# Patient Record
Sex: Female | Born: 1985 | Race: White | Hispanic: No | Smoking: Former smoker
Health system: Southern US, Community
[De-identification: ages and names within clinical notes are randomized; demographics above are authoritative.]

## PROBLEM LIST (undated history)

## (undated) DIAGNOSIS — F32A Depression, unspecified: Secondary | ICD-10-CM

## (undated) DIAGNOSIS — T7840XA Allergy, unspecified, initial encounter: Secondary | ICD-10-CM

## (undated) DIAGNOSIS — F329 Major depressive disorder, single episode, unspecified: Secondary | ICD-10-CM

## (undated) DIAGNOSIS — R87619 Unspecified abnormal cytological findings in specimens from cervix uteri: Secondary | ICD-10-CM

## (undated) DIAGNOSIS — F419 Anxiety disorder, unspecified: Secondary | ICD-10-CM

## (undated) HISTORY — DX: Unspecified abnormal cytological findings in specimens from cervix uteri: R87.619

## (undated) HISTORY — PX: KNEE SURGERY: SHX244

## (undated) HISTORY — DX: Anxiety disorder, unspecified: F41.9

## (undated) HISTORY — DX: Depression, unspecified: F32.A

## (undated) HISTORY — DX: Allergy, unspecified, initial encounter: T78.40XA

## (undated) HISTORY — PX: TUBAL LIGATION: SHX77

## (undated) HISTORY — DX: Major depressive disorder, single episode, unspecified: F32.9

## (undated) HISTORY — PX: COLPOSCOPY: SHX161

## (undated) HISTORY — PX: WISDOM TOOTH EXTRACTION: SHX21

---
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
## 2003-07-12 ENCOUNTER — Other Ambulatory Visit: Admission: RE | Admit: 2003-07-12 | Discharge: 2003-07-12 | Payer: Self-pay | Admitting: Obstetrics and Gynecology

## 2003-07-19 ENCOUNTER — Encounter: Admission: RE | Admit: 2003-07-19 | Discharge: 2003-07-19 | Payer: Self-pay | Admitting: Family Medicine

## 2004-03-11 ENCOUNTER — Encounter: Admission: RE | Admit: 2004-03-11 | Discharge: 2004-03-11 | Payer: Self-pay | Admitting: Family Medicine

## 2004-12-06 ENCOUNTER — Emergency Department (HOSPITAL_COMMUNITY): Admission: EM | Admit: 2004-12-06 | Discharge: 2004-12-07 | Payer: Self-pay | Admitting: Emergency Medicine

## 2005-03-06 ENCOUNTER — Emergency Department (HOSPITAL_COMMUNITY): Admission: EM | Admit: 2005-03-06 | Discharge: 2005-03-07 | Payer: Self-pay | Admitting: Emergency Medicine

## 2005-08-12 ENCOUNTER — Ambulatory Visit (HOSPITAL_BASED_OUTPATIENT_CLINIC_OR_DEPARTMENT_OTHER): Admission: RE | Admit: 2005-08-12 | Discharge: 2005-08-13 | Payer: Self-pay | Admitting: Orthopaedic Surgery

## 2006-03-09 ENCOUNTER — Emergency Department (HOSPITAL_COMMUNITY): Admission: EM | Admit: 2006-03-09 | Discharge: 2006-03-09 | Payer: Self-pay | Admitting: Emergency Medicine

## 2006-03-09 ENCOUNTER — Other Ambulatory Visit: Admission: RE | Admit: 2006-03-09 | Discharge: 2006-03-09 | Payer: Self-pay | Admitting: Obstetrics & Gynecology

## 2007-02-12 ENCOUNTER — Emergency Department (HOSPITAL_COMMUNITY): Admission: EM | Admit: 2007-02-12 | Discharge: 2007-02-12 | Payer: Self-pay | Admitting: Emergency Medicine

## 2007-05-14 ENCOUNTER — Other Ambulatory Visit: Admission: RE | Admit: 2007-05-14 | Discharge: 2007-05-14 | Payer: Self-pay | Admitting: Obstetrics and Gynecology

## 2007-11-05 ENCOUNTER — Other Ambulatory Visit: Admission: RE | Admit: 2007-11-05 | Discharge: 2007-11-05 | Payer: Self-pay | Admitting: Obstetrics and Gynecology

## 2008-09-15 ENCOUNTER — Emergency Department (HOSPITAL_COMMUNITY): Admission: EM | Admit: 2008-09-15 | Discharge: 2008-09-15 | Payer: Self-pay | Admitting: Emergency Medicine

## 2008-10-05 IMAGING — CT CT ANGIO CHEST
2 of 9 series · 16 of 36 positions shown · IV contrast (APPLIED)
Comparison: None

CLINICAL DATA: Shortness of breath, cough

CT ANGIOGRAPHY OF CHEST - PULMONARY EMBOLISM PROTOCOL:
TECHNIQUE: Multidetector CT imaging of the chest was performed according to the
protocol for detection of pulmonary embolism during bolus injection of
intravenous contrast.  Coronal and sagittal plane CT angiographic image
reconstructions were also generated.
Contrast:  100 cc Omnipaque 300
TECHNIQUE: Multidetector CT imaging of the abdomen and pelvis was performed
following the standard protocol during bolus administration of intravenous
contrast.
ABDOMEN CT WITH CONTRAST

[Series 8: pe 1.0 b40f thins for pacs · axial · 0.70mm/px · z∈[+512,+720]mm · 12 of 246 slices shown]
[im 19/246  lung]
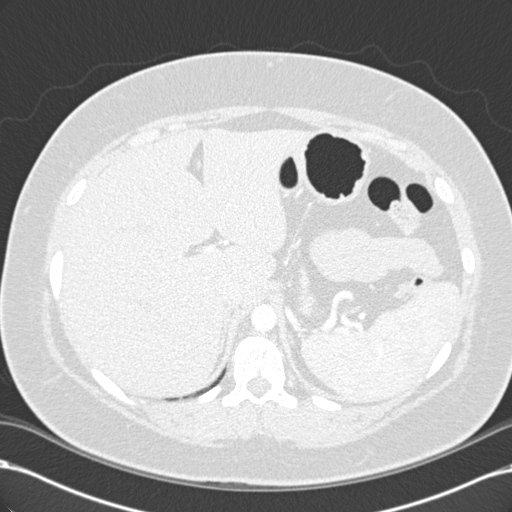
[im 38/246  mediastinal]
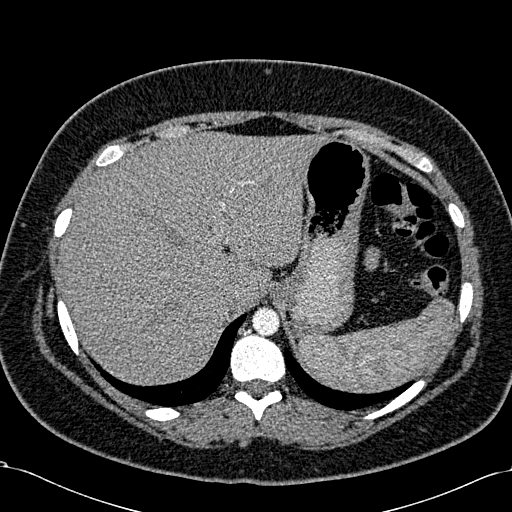
[im 57/246  lung]
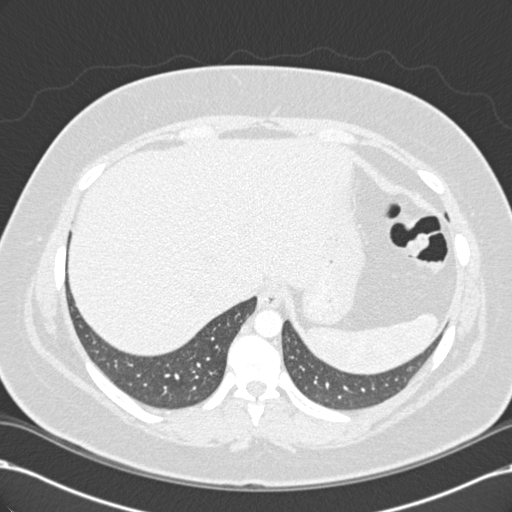
[im 76/246  mediastinal]
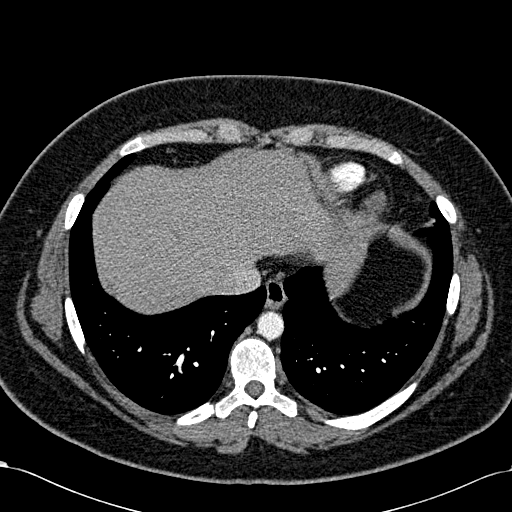
[im 95/246  lung]
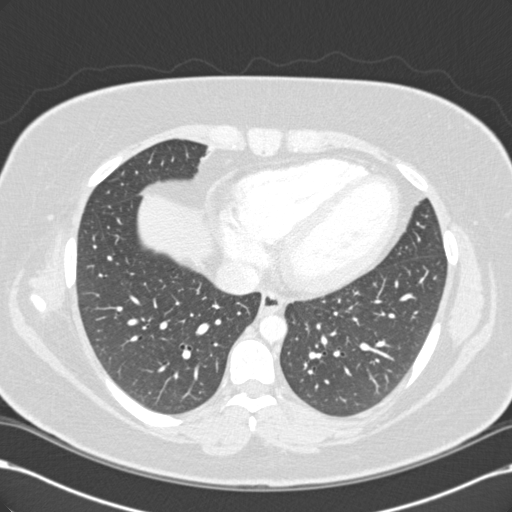
[im 114/246  mediastinal]
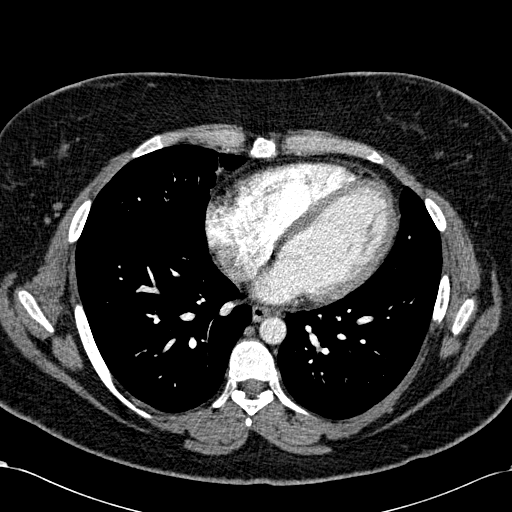
[im 132/246  lung]
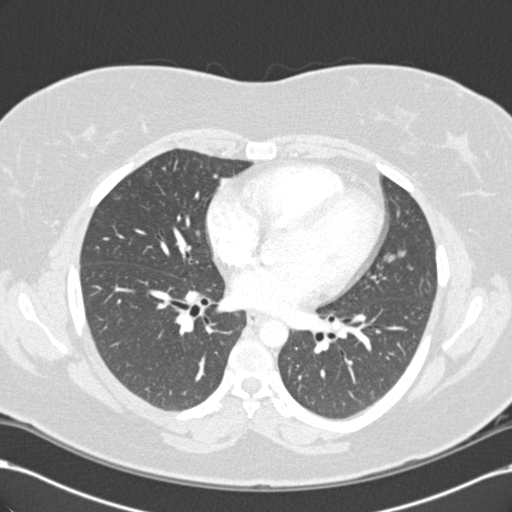
[im 151/246  mediastinal]
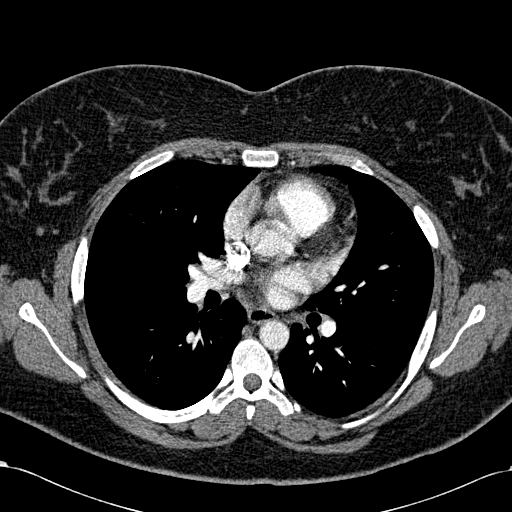
[im 170/246  lung]
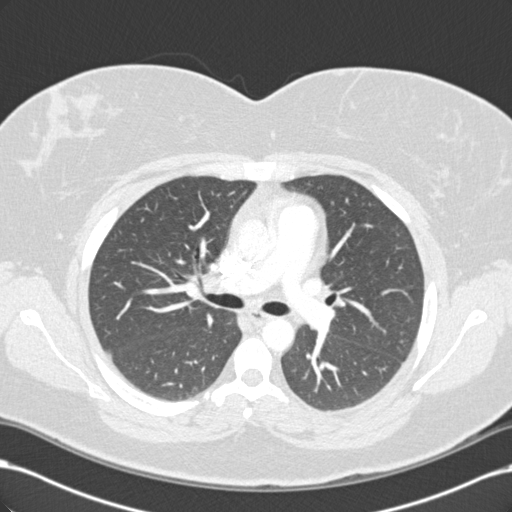
[im 189/246  mediastinal]
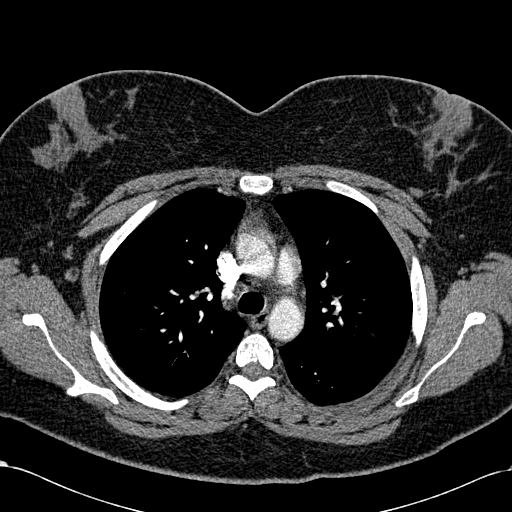
[im 208/246  lung]
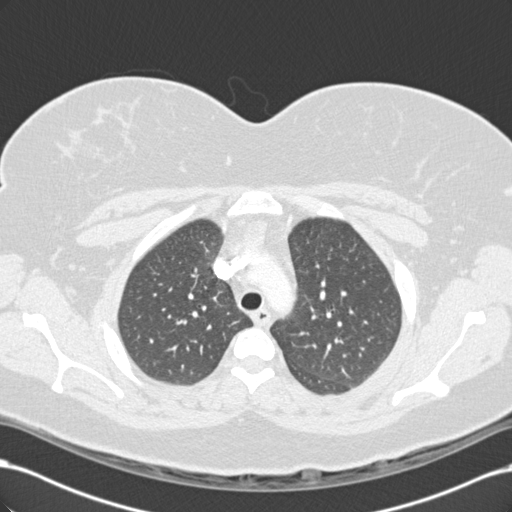
[im 227/246  mediastinal]
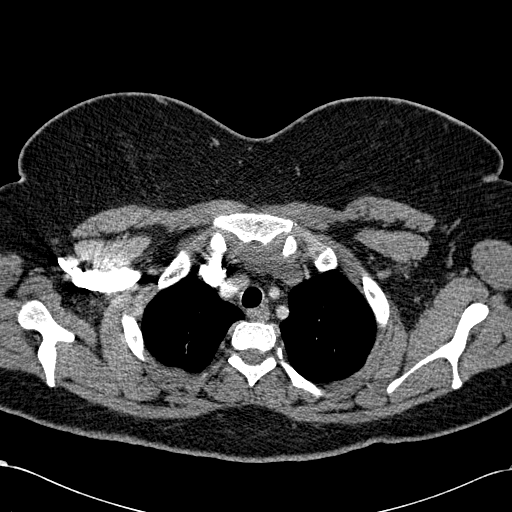

[Series 9: abd_pel 5.0 b40s · axial · 0.79mm/px · z∈[+191,+491]mm · 4 of 102 slices shown]
[im 21/102  lung]
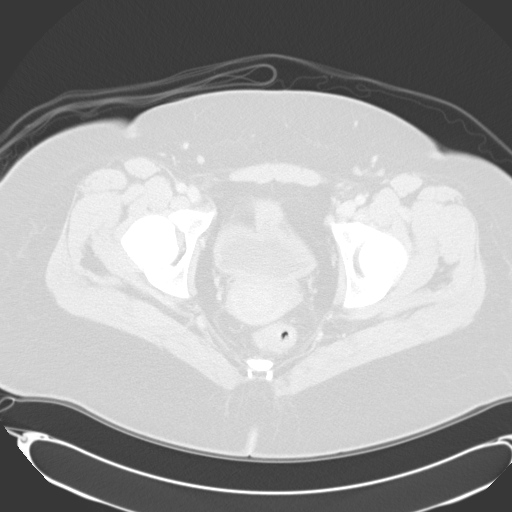
[im 41/102  lung]
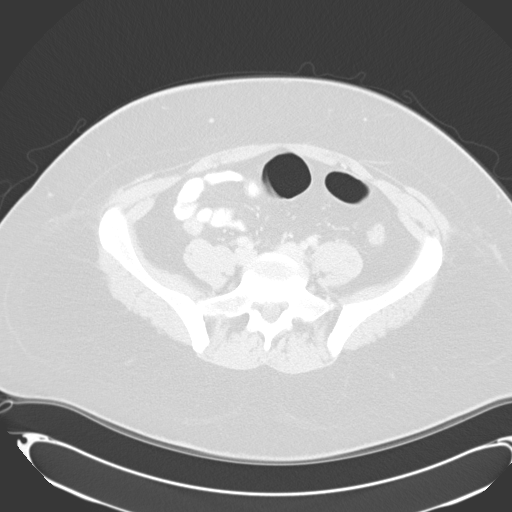
[im 61/102  lung]
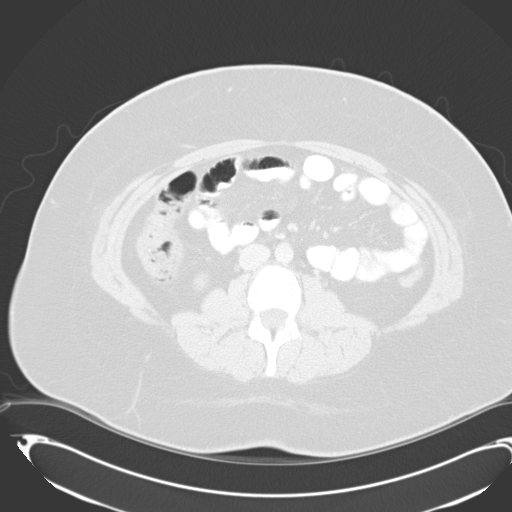
[im 81/102  lung]
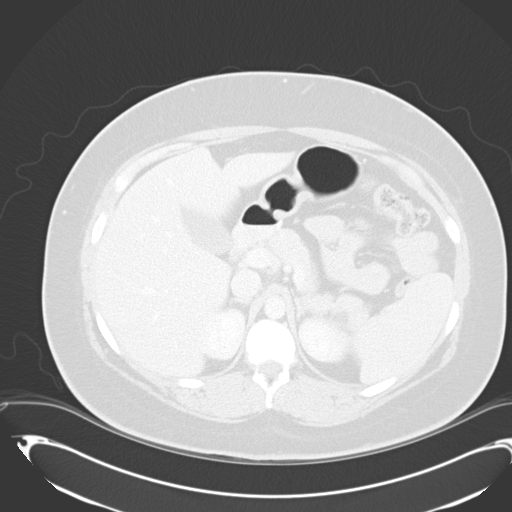

[16 of 36 positions shown; findings below may reference images not displayed]

FINDINGS: No filling defects are seen the pulmonary arteries to suggest
pulmonary emboli. Heart is normal size. Aorta normal caliber. No mediastinal,
hilar, or axillary adenopathy. Soft tissue noted in the anterior mediastinum,
felt to represent thymus. No effusions. No focal opacities in the lungs.

Thyroid and chest wall soft tissues are unremarkable.
IMPRESSION: No acute findings in the chest. No pulmonary embolus or focal opacity.
FINDINGS: Solid organs are unremarkable. Gallbladder and bowel grossly
unremarkable. No free fluid, free air, or adenopathy. Bony structures
unremarkable.

IMPRESSION

No acute findings in the abdomen.

PELVIS CT WITH CONTRAST
FINDINGS: Uterus and adnexa grossly unremarkable. No free fluid, free air, or
adenopathy. Bowel grossly unremarkable. It appears the patient is status post
appendectomy. Bony structures unremarkable.

IMPRESSION

Apparent prior appendectomy. No acute findings.

## 2010-10-25 NOTE — Op Note (Signed)
NAME:  Amanda Davis, Amanda Davis NO.:  [id number]   MEDICAL RECORD NO.:  [id number]          PATIENT TYPE:  AMB   LOCATION:  DSC                          FACILITY:  [facility]   PHYSICIAN:  Lubertha Basque. Dalldorf, M.D.DATE OF BIRTH:  15-Feb-1986   DATE OF PROCEDURE:  08/12/2005  DATE OF DISCHARGE:                                 OPERATIVE REPORT   PREOPERATIVE DIAGNOSIS:  1.  Left knee chondromalacia of the patella.  2.  Left knee patellofemoral instability.   POSTOPERATIVE DIAGNOSIS:  1.  Left knee chondromalacia of the patella.  2.  Left knee patellofemoral instability.   PROCEDURE:  1.  Left knee arthroscopic chondroplasty.  2.  Left knee arthroscopic lateral release.  3.  Left knee open Fulkerson slide procedure.   ANESTHESIA:  General.   SURGEON:  Lubertha Basque. Jerl Santos, M.D.   ASSISTANT:  Lindwood Qua, P.A.-C.   INDICATIONS FOR PROCEDURE:  The patient is a 25 year old woman with about  six or eight months of left knee patellofemoral instability.  She has  dislocated on several occasions.  She has been treated with extensive  physical therapy and bracing but continues with symptoms of instability.  She does not trust her knee.  She is offered a Youth worker  along with some arthroscopic procedures.  Informed operative consent was  obtained after a discussion of possible complications of reaction to  anesthesia, infection, DVT, and recurrent dislocation.   DESCRIPTION OF PROCEDURE:  The patient was taken to the operating suite  where general anesthetic was applied without difficulty.  She is positioned  supine and prepped and draped in normal sterile fashion.  After the  administration of preop IV antibiotic, an arthroscopy of the left knee was  performed through a total of three portals.  Medial and lateral compartments  were benign with no evidence of meniscal or articular cartilage injury.  The  ACL appeared to be intact.  The patellofemoral joint  tracked in a far  lateral position.  Even with the knee flexed to past 90 degrees, the knee  cap did not come down into the intracochlear groove.  I could easily  dislocate her knee cap.  She did have some mild chondromalacia addressed  with a brief chondroplasty of the patellar surface.  I then performed an  arthroscopic lateral release through the additional third portal.  Arthroscopic equipment was then removed temporarily.  The leg was elevated,  exsanguinated, and a tourniquet inflated about the thigh.  An anterior  longitudinal incision was made from the tibial tubercle distal.  Dissection  was carried down to the patellar tendon attachment site on the tibial  tubercle.  I extended the lateral release from this spot up to the  arthroscopic portion.  I then used an oscillating saw to make a cut on the  tibial tubercle tapering distally to the anterior cortex of the tibia.  I  did place a slight bevel to this so as when we translated the osteotomized  portion of bone in the medial direction, it also went slightly anterior.  We  took our translation about 1 to  1.5 cm in a medial direction.  I then  secured this osteotomized portion of bone with two partially threaded  cancellous screws from the small fragment set, which both measured 45 mm in  length.  One achieved good purchase in cancellus bone and the other did  slightly purchase on the posterior cortex of the tibia.  I used fluoroscopy  to confirm adequate placement of hardware and read these views myself.  I  then placed the arthroscope back into the knee and the patella did track in  a normal position easily coming down into the intracochlear groove at 60  degrees of flexion.  We could not dislocate her knee cap at the end of the  case.  The tourniquet was deflated and a small amount of bleeding was easily  controlled with Bovie cautery.  The subcutaneous tissues were reapproximated  with 2-0 undyed Vicryl followed by skin closure  with a sealant.  Adaptic was  applied followed by dry gauze and a loose Ace wrap plus a knee immobilizer.  Estimated blood loss and interoperative fluids can be obtained from  anesthesia records as can accurate tourniquet time.   DISPOSITION:  The patient was extubated in operating room and taken to the  recovery in stable addition.  The plans were for her to be admitted  overnight for observation and pain control with probable discharged on the  morning.      Lubertha Basque Jerl Santos, M.D.  Electronically Signed     PGD/MEDQ  D:  08/12/2005  T:  08/12/2005  Job:  [id number]

## 2010-10-25 NOTE — Op Note (Signed)
[place]  Patient:    Amanda Davis, Amanda Davis Visit Number: [id number] MRN: [id number]          Service Type: PED Location: [phone] Attending Physician:  Leonia Corona Dictated by:   Judie Petit. Leonia Corona, M.D. Proc. Date: 10/21/01 Admit Date:  10/21/2001 Discharge Date: 10/22/2001   CC:         Evlyn Kanner, M.D.   Operative Report  PREOPERATIVE DIAGNOSIS:  Acute appendicitis.  POSTOPERATIVE DIAGNOSIS:  Acute appendicitis.  PROCEDURE:  Laparoscopic appendectomy.  SURGEON:  Nelida Meuse, M.D.  ASSISTANTDonnella Bi D. Pendse, M.D.  ANESTHESIA:  General endotracheal tube anesthesia.  DESCRIPTION OF PROCEDURE:  The patient is brought into the operating room, placed supine on the operating table.  General endotracheal tube anesthesia is given.  The patient was catheterized with a Foley catheter during the period of surgery.  The abdominal wall from the nipples to the knees was cleaned, prepped, and draped in the usual manner.  The laparoscopic set-up was placed. A 10/12 camera port was first placed at the umbilicus, for which a 0.5 cm incision was made infraumbilically and the fascia was grasped with two Kocher clamps.  The fascia was incised, and a finger was introduced and directly felt into the peritoneum.  The Hasson cannula was now placed under direct vision. Two stay sutures with 0 Vicryl were placed on the fascia and tied around the Hasson cannula to hold it in place.  The CO2 insufflation was obtained to a desired pressure of about 14, and the camera was introduced and interior or the peritoneal cavity was visualized after inflation.  The second cannula, 5 mm, was placed in the right lower quadrant just above the McBurneys point under direct vision of the camera from within the peritoneal cavity.  The third 10/12 mm port was placed in the left lower quadrant under direct vision of the camera from within the peritoneal  cavity, working through the camera in the umbilical port and the cecum was visualized, which was grasped with the help of grasping forceps through the 5 mm port and the grasper through the 10/12 mm port on the left lower quadrant, the appendix was identified.  The appendix was held up with a grasper and then the base was cleared and with the help of dissectors and after the mesoappendix was cleared, laparoscopic stapler was fired through the mesoappendix, clearing the base of the appendix. The stapler was fired at the base of the appendix, dividing the appendix from the cecum.  The appendix appeared to be severely inflamed at the tip; the base, however, appeared very healthy.  There was no free fluid or pus around the appendix.  The Endobag was now introduced through the 10/12 mm port in the left lower quadrant and the appendix was fed into the bag and the bag closed and delivered along with the port through the left lower quadrant.  The port was replaced back and then the right lower quadrant was irrigated.  The stump of the appendix was visualized.  The cecum appeared healthy with well-sealed stump of the appendix.  No bleeding or oozing was visualized.  The returning fluid on the suction was clear.  At this point the pelvic organs were visualized.  The uterus appeared healthy.  Both the ovaries and the tubes appeared normal.  There was a small cyst noted on the left ovary.  After once again visualizing all the viscera and irrigating the right lower quadrant,  the returning fluid being clear, we removed the left lower quadrant port and then the right lower quadrant port under direct vision of the camera, inspecting the interior for any oozing or bleeding, and none was noted, and finally the camera port was removed.  The left lower quadrant 10/12 mm port site was closed in two layers, the deeper fascial layer using 0 Vicryl with a figure-of-eight stitch and the skin with 4-0 Vicryl, the  umbilical port also in two layers, the fascia with 0 Vicryl figure-of-eight stitch and the skin with 4-0 Vicryl subcuticular stitch, and then the right lower quadrant 5 mm port was closed only at the skin using 5-0 Monocryl.  Steri-Strips were applied, which was covered with sterile gauze and Tegaderm dressing.  The patient tolerated the procedure very well, which was smooth and uneventful. The patient was later extubated and transported to the recovery room in good and stable condition. Dictated by:   Judie Petit. Leonia Corona, M.D. Attending Physician:  Leonia Corona DD:  10/21/01 TD:  10/25/01 Job: [id number]

## 2010-10-25 NOTE — Op Note (Signed)
NAME:  Amanda Davis, Amanda Davis NO.:  [id number]   MEDICAL RECORD NO.:  [id number]                   PATIENT TYPE:  AMB   LOCATION:  DSC                                  FACILITY:  [facility]   PHYSICIAN:  Brantley Persons, M.D.             DATE OF BIRTH:  04/12/1986   DATE OF PROCEDURE:  11/14/2002  DATE OF DISCHARGE:                                 OPERATIVE REPORT   PREOPERATIVE DIAGNOSIS:  Bilateral macromastia.   POSTOPERATIVE DIAGNOSIS:  Bilateral macromastia.   PROCEDURE:  Bilateral reduction mammoplasties.   SURGEON:  Brantley Persons, M.D.   ASSISTANT:  Raynald Kemp, M.D.   ANESTHESIA:  General endotracheal anesthesia, anesthesiologist Burna Forts, M.D.   ESTIMATED BLOOD LOSS:  400 cc.   FLUIDS REPLACED:  2800 cc crystalloid.   URINE OUTPUT:  400 cc.   COMPLICATIONS:  None.   INDICATIONS FOR PROCEDURE:  The patient is an 25 year old Caucasian female  with bilateral macromastia that is clinically symptomatic.  She complains  her neck aches, her back aches, headaches along with should strap groove  marks and pain. The patient presents to undergo bilateral reduction  mammoplasties. Justification for overnight stay, progressive pain control  along with ambulation and monitoring of the nipples and breast flaps.   DESCRIPTION OF PROCEDURE:  The patient was marked in the preoperative  holding area in the pattern is Wise for the future bilateral reduction  mammoplasties. She was then taken back to the operating room and placed on  the table in the supine position. After adequate general endotracheal  anesthesia was obtained, the patient's chest was prepped with Betadine and  alcohol  and draped in the usual sterile fashion. The patient's breasts were  then injected with 1% Lidocaine with epinephrine. After adequate hemostasis  had taken effect, the procedure was begun.   First the right breast reduction was performed. The nipple was  marked with a  45-mm nipple marker. The skin was then incised and de-epithelialized around  the nipple down to the inframammary crease and inferior pedicle pattern.  Next  the medial, costal, superior  and lateral  skin  flaps were elevated  down to the chest wall. The excess fat and glandular tissue were removed  from the inferior  pedicle. The nipple was then examined and found to be  pink and viable. The wound was irrigated with saline irrigation. Meticulous  hemostasis was obtained with the Bovie electrocautery. The inferior pedicle  was centralized using 3-0 Prolene suture. A #10 JP flat, fully fluted drain  was then placed into the wound. The skin flaps were brought together at the  inverted T junction with a 2-0 Prolene suture. The incision was then stapled  for temporary closure.   Next the left breast reduction was performed. The nipple was marked with a  45-mm nipple marker. The skin was then incised and de-epithelialized around  the nipple down  to the inframammary crease and inferior  pedicle pattern.  Next the medial, superior and lateral skin flaps were elevated down to chest  wall. The excess fat and glandular tissue were removed from the inferior  pedicle. The nipple was then examined and found to be pink and viable. The  wound was irrigated with saline irrigation. Meticulous hemostasis was  obtained with the Bovie electrocautery. The inferior pedicle was centralized  using 3-Prolene suture. A #10 JP flat, fully fluted drain was then placed  into the wound. The skin flaps were brought together at the inverted T  junction with a 2-0 Prolene suture. The incision was stapled for temporary  closure.   The breasts were then compared and found to have good shape and symmetry.  The staples were then removed and the incisions closed using 3-0 Monocryl in  the dermal layer followed  by a 4-0 Monocryl and an intracuticular stitch on  the skin. The patient was then placed in the  upright position. The future  location of the nipple areolar complexes was marked on both breasts using  the 38-mm nipple marker. The patient was then placed back into the recumbent  position.   First the future nipple areolar complex was incised on the right breast  mound. This tissue was excised full thickness. The nipple was then examined  and found to be pink and viable and brought out into this aperture in  standard plan using 4-0 Monocryl in the dermal layer followed  by 5-0  Monocryl in the intracuticular stitch on the skin. In a likewise manner the  future nipple areolar complex was incised on the left breast mound. This  tissue was excised full thickness. The nipple was then examined and found to  be pink and viable and brought out into this aperture and sewn in place  using 4-0 Monocryl in the dermal layer followed by a 5-0 Monocryl running  intracuticular stitch on the skin.   Jackson-Pratt drains were then sewn into place using 3-0 nylon suture. The  incisions were dressed with Benzoin and Steri-Strips, and the nipples  additionally with Bacitracin ointment and Adaptic. Then  4 x 4s were placed over the incision and the patient was placed in a light  postoperative support bra.   There were no complications. The patient tolerated the procedure well. Final  needle and sponge counts were reported  to be correct at the end of the  case. The patient was then awakened from the general anesthesia and was  taken to the recovery room in stable condition. Discharge  was planned for  the morning. Amount of breast tissue removed right breast is 570 gm, left  breast is 732 gm.                                               Brantley Persons, M.D.    MC/MEDQ  D:  11/14/2002  T:  11/14/2002  Job:  [id number]   cc:   Raynald Kemp, M.D.  [address]  [place]  [place] [address]  Fax: [phone]

## 2011-03-21 LAB — COMPREHENSIVE METABOLIC PANEL
ALT: 13
BUN: 6
Calcium: 9
Glucose, Bld: 105 — ABNORMAL HIGH
Sodium: 138
Total Protein: 6.7

## 2011-03-21 LAB — DIFFERENTIAL
Lymphocytes Relative: 23
Lymphs Abs: 3.9 — ABNORMAL HIGH
Monocytes Relative: 3
Neutro Abs: 12 — ABNORMAL HIGH
Neutrophils Relative %: 70

## 2011-03-21 LAB — CBC
Hemoglobin: 12.6
MCHC: 34.4
RDW: 12.6

## 2011-03-21 LAB — URINALYSIS, ROUTINE W REFLEX MICROSCOPIC
Nitrite: NEGATIVE
Specific Gravity, Urine: 1.017
Urobilinogen, UA: 0.2

## 2011-03-21 LAB — PREGNANCY, URINE: Preg Test, Ur: NEGATIVE

## 2011-03-21 LAB — LIPASE, BLOOD: Lipase: 17

## 2012-03-29 DIAGNOSIS — E559 Vitamin D deficiency, unspecified: Secondary | ICD-10-CM | POA: Insufficient documentation

## 2012-03-29 DIAGNOSIS — Z8632 Personal history of gestational diabetes: Secondary | ICD-10-CM | POA: Insufficient documentation

## 2014-02-08 ENCOUNTER — Other Ambulatory Visit: Payer: Self-pay | Admitting: Family Medicine

## 2014-02-08 DIAGNOSIS — R1013 Epigastric pain: Secondary | ICD-10-CM

## 2014-02-16 ENCOUNTER — Ambulatory Visit
Admission: RE | Admit: 2014-02-16 | Discharge: 2014-02-16 | Disposition: A | Discharge status: Home / Self Care | Payer: 59 | Source: Ambulatory Visit | Attending: Family Medicine | Admitting: Family Medicine

## 2014-02-16 DIAGNOSIS — R1013 Epigastric pain: Secondary | ICD-10-CM

## 2014-12-15 ENCOUNTER — Ambulatory Visit (INDEPENDENT_AMBULATORY_CARE_PROVIDER_SITE_OTHER): Payer: 59 | Admitting: Nurse Practitioner

## 2014-12-15 ENCOUNTER — Encounter: Payer: Self-pay | Admitting: Nurse Practitioner

## 2014-12-15 VITALS — BP 126/70 | HR 88 | Resp 16 | Ht 68.25 in | Wt 266.0 lb

## 2014-12-15 DIAGNOSIS — Z0000 Encounter for general adult medical examination without abnormal findings: Secondary | ICD-10-CM | POA: Diagnosis not present

## 2014-12-15 DIAGNOSIS — Z01419 Encounter for gynecological examination (general) (routine) without abnormal findings: Secondary | ICD-10-CM

## 2014-12-15 MED ORDER — NORETHIN ACE-ETH ESTRAD-FE 1-20 MG-MCG PO TABS: 1.0000 | ORAL_TABLET | Freq: Every day | ORAL | Status: DC

## 2014-12-15 NOTE — Progress Notes (Signed)
29 y.o. [id number] Separated Caucasian Fe here for NGYN annual exam. Previous pt here but last here in 2009.  Had moved to [place] with husbands job and [employer].  Menses regular at 30 - 32 days, last 5 days, heavy for 3 days.  Super tampon changing every 3 hours. Cramps but does not use OTC NSAID's.   Now separated since 07/2014, does not believe another partner involved.   She now wants to go back on OCP for cycle regulation.  Prior use of Ortho tri cyclen.  Patient's last menstrual period was 11/16/2014.          Sexually active: No.  The current method of family planning is abstinence.    Exercising: No.  The patient does not participate in regular exercise at present. 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 She has never used smokeless tobacco. She reports that she drinks about 0.6 - 1.2 oz of alcohol per week. She reports that she does not use illicit drugs.  Past Medical History  Diagnosis Date  . Anxiety   . Depression   . Abnormal Pap smear of cervix ~2005    colpo biopsy  . Hemorrhage after delivery of fetus 2011    Past Surgical History  Procedure Laterality Date  . Wisdom tooth extraction    . Colposcopy  ~2005    Normal - per pt  . Appendectomy  2003  . Breast surgery Bilateral 11/14/2002    Breast Reduction     Current Outpatient Prescriptions  Medication Sig Dispense Refill  . Multiple Vitamin (MULTIVITAMIN) tablet Take 1 tablet by mouth daily.    . norethindrone-ethinyl estradiol (JUNEL FE,GILDESS FE,LOESTRIN FE) 1-20 MG-MCG tablet Take 1 tablet by mouth daily. 3 Package 3   No current facility-administered medications for this visit.    Family History  Problem Relation Age of Onset  . Adopted: Yes    ROS:  Pertinent items are noted in HPI.   Otherwise, a comprehensive ROS was negative.  Exam:   BP 126/70 mmHg  Pulse 88  Resp 16  Ht 5' 8.25" (1.734 m)  Wt 266 lb (120.657 kg)  BMI 40.13 kg/m2  LMP 11/16/2014 Height: 5' 8.25" (173.4 cm) Ht Readings from Last 3 Encounters:  12/15/14 5' 8.25" (1.734 m)    General appearance: alert, cooperative and appears stated age Head: Normocephalic, without obvious abnormality, atraumatic Neck: no adenopathy, supple, symmetrical, trachea midline and thyroid normal to inspection and palpation Lungs: clear to auscultation bilaterally Breasts: normal appearance, no masses or tenderness, bilateral surgaical scars from reduction Heart: regular rate and rhythm Abdomen: soft, non-tender; no masses,  no organomegaly Extremities: extremities normal, atraumatic, no cyanosis or edema Skin: Skin color, texture, turgor normal. No rashes or lesions Lymph nodes: Cervical, supraclavicular, and axillary nodes normal. No abnormal inguinal nodes palpated Neurologic: Grossly normal   Pelvic: External genitalia:  no lesions              Urethra:  normal appearing urethra with no masses, tenderness or lesions              Bartholin's and Skene's: normal                 Vagina: normal appearing vagina with normal  color and discharge, no lesions              Cervix: anteverted              Pap taken: Yes.   Bimanual Exam:  Uterus:  normal size, contour, position, consistency, mobility, non-tender              Adnexa: no mass, fullness, tenderness               Rectovaginal: Confirms               Anus:  normal sphincter tone, no lesions  Chaperone present: Yes  A:  Well Woman with normal exam  Currently not SA, separated  Menorrhagia and Dysmenorrhea - wants to restart OCP  S/P Breast reduction 11/2002  Does not desire future pregnancy and may want to consider BTL in the future  Remote history of abnormal pap ~2005  P:   Reviewed health and wellness pertinent to exam  Pap smear as above  RX for  Loestrin 1/20 Fe for a year.  Reviewed start date, BUM, and potential side effects and risk.  She is a non smoker now.  Counseled on breast self exam, STD prevention, use and side effects of OCP's, adequate intake of calcium and vitamin D, diet and exercise return annually or prn  An After Visit Summary was printed and given to the patient.

## 2014-12-15 NOTE — Patient Instructions (Signed)

## 2014-12-18 NOTE — Progress Notes (Signed)
Encounter reviewed by Dr. Chela Sutphen Amundson C. Silva.  

## 2014-12-19 LAB — IPS PAP TEST WITH HPV

## 2015-12-21 ENCOUNTER — Ambulatory Visit (INDEPENDENT_AMBULATORY_CARE_PROVIDER_SITE_OTHER): Payer: 59 | Admitting: Nurse Practitioner

## 2015-12-21 ENCOUNTER — Encounter: Payer: Self-pay | Admitting: Nurse Practitioner

## 2015-12-21 VITALS — BP 112/80 | HR 64 | Ht 67.25 in | Wt 281.0 lb

## 2015-12-21 DIAGNOSIS — Z01419 Encounter for gynecological examination (general) (routine) without abnormal findings: Secondary | ICD-10-CM | POA: Diagnosis not present

## 2015-12-21 DIAGNOSIS — Z0000 Encounter for general adult medical examination without abnormal findings: Secondary | ICD-10-CM | POA: Diagnosis not present

## 2015-12-21 DIAGNOSIS — N926 Irregular menstruation, unspecified: Secondary | ICD-10-CM | POA: Insufficient documentation

## 2015-12-21 DIAGNOSIS — R635 Abnormal weight gain: Secondary | ICD-10-CM | POA: Insufficient documentation

## 2015-12-21 LAB — THYROID PANEL WITH TSH
FREE THYROXINE INDEX: 2 (ref 1.4–3.8)
T3 UPTAKE: 30 % (ref 22–35)
T4 TOTAL: 6.5 ug/dL (ref 4.5–12.0)
TSH: 1.4 m[IU]/L

## 2015-12-21 LAB — HEMOGLOBIN, FINGERSTICK: Hemoglobin, fingerstick: 13.4 g/dL (ref 12.0–16.0)

## 2015-12-21 LAB — HEMOGLOBIN A1C
HEMOGLOBIN A1C: 5.4 % (ref <5.7)
MEAN PLASMA GLUCOSE: 108 mg/dL

## 2015-12-21 MED ORDER — LEVONORGESTREL-ETHINYL ESTRAD 0.15-30 MG-MCG PO TABS: 1.0000 | ORAL_TABLET | Freq: Every day | ORAL | Status: DC

## 2015-12-21 NOTE — Progress Notes (Signed)
Patient ID: Amanda MottsJuliane E Beaudoin, female   DOB: 06/24/1985, 30 y.o.   MRN: [id number]  30 y.o. G2P2002 Legally Separated  Caucasian Fe here for annual exam.  She is currently on Loestrin OCP.  In April had late cycle bleeding without a missed pill.  Then in May had a lighter cycle.  Then in June no cycle and stopped OCP.  Then in July started today which is 2 weeks late. Not dating or SA.  Still legally separated.  She desires to change OCP since she does nt want recent problems with cycle being irregualr.  She wants to have BTL and wants to move forward with this.  She is having a lot of problems loosing weight despite cardio exercise for 1 hour 5 days a week.  Since last year up 20 lbs.  She is aware that stress is causing a lot of this.  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  Patient's last menstrual period was 12/21/2015 (exact date).          Sexually active: No.  The current method of family planning is none and abstinence.    Exercising: Yes.    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 She has never used smokeless tobacco. She reports that she drinks about 0.6 - 1.2 oz of alcohol per week. She reports that she does not use illicit drugs.  Past Medical History  Diagnosis Date  . Anxiety   . Depression   . Abnormal Pap smear of cervix ~2005    colpo biopsy  . Hemorrhage after delivery of fetus 2011    Past Surgical History  Procedure Laterality Date  . Wisdom tooth extraction    . Colposcopy  ~2005    Normal - per pt  . Appendectomy  2003  . Breast surgery Bilateral 11/14/2002    Breast Reduction     Current Outpatient Prescriptions  Medication Sig Dispense Refill  . Multiple Vitamin (MULTIVITAMIN) tablet Take 1 tablet by mouth daily.    [name].  levonorgestrel-ethinyl estradiol (NORDETTE) 0.15-30 MG-MCG tablet Take 1 tablet by mouth daily. 3 Package 3   No current facility-administered medications for this visit.    Family History  Problem Relation Age of Onset  . Adopted: Yes    ROS:  Pertinent items are noted in HPI.  Otherwise, a comprehensive ROS was negative.  Exam:   BP 112/80 mmHg  Pulse 64  Ht 5' 7.25" (1.708 m)  Wt 281 lb (127.461 kg)  BMI 43.69 kg/m2  LMP 12/21/2015 (Exact Date) Height: 5' 7.25" (170.8 cm) Ht Readings from Last 3 Encounters:  12/21/15 5' 7.25" (1.708 m)  12/15/14 5' 8.25" (1.734 m)    General appearance: alert, cooperative and appears stated age Head: Normocephalic, without obvious abnormality, atraumatic Neck: no adenopathy, supple, symmetrical, trachea midline and thyroid normal to inspection and palpation Lungs: clear to auscultation bilaterally Breasts: normal appearance, no masses or tenderness Heart: regular rate and rhythm Abdomen: soft, non-tender; no masses,  no organomegaly Extremities: extremities normal, atraumatic, no cyanosis or edema Skin: Skin color, texture, turgor normal. No rashes or lesions Lymph nodes: Cervical, supraclavicular, and axillary nodes normal. No abnormal inguinal nodes palpated Neurologic: Grossly normal   Pelvic: External genitalia:  no lesions  Urethra:  normal appearing urethra with no masses, tenderness or lesions              Bartholin's and Skene's: normal                 Vagina: normal appearing vagina with normal color and discharge, no lesions              Cervix: anteverted              Pap taken: No. Bimanual Exam:  Uterus:  normal size, contour, position, consistency, mobility, non-tender              Adnexa: no mass, fullness, tenderness               Rectovaginal: Confirms               Anus:  normal sphincter tone, no lesions  Chaperone present: yes  A:  Well Woman with normal exam  Currently not SA,  separated Menorrhagia and Dysmenorrhea - wants to restart OCP S/P Breast reduction 11/2002 Does not desire future pregnancy and may want to consider BTL in the near future Remote history of abnormal pap ~2005   P:   Reviewed health and wellness pertinent to exam  Pap smear not done - history of abnormal 2005, since then nornmal  Information given about BTL and  Will return for a consutl visit  Will do some labs for weight gain and follow   Given new RX for  Nordette to take as directed  DC Loestrin secondary to AUB  Counseled on breast self exam, use and side effects of OCP's, adequate intake of calcium and vitamin D, diet and exercise return annually or prn  An After Visit Summary was printed and given to the patient.

## 2015-12-21 NOTE — Patient Instructions (Signed)

## 2015-12-23 NOTE — Progress Notes (Signed)
Reviewed personally.  M. Suzanne Ravyn Nikkel, MD.  

## 2015-12-25 ENCOUNTER — Other Ambulatory Visit: Payer: Self-pay | Admitting: Nurse Practitioner

## 2016-02-04 ENCOUNTER — Ambulatory Visit (INDEPENDENT_AMBULATORY_CARE_PROVIDER_SITE_OTHER): Payer: 59 | Admitting: Obstetrics and Gynecology

## 2016-02-04 ENCOUNTER — Encounter: Payer: Self-pay | Admitting: Obstetrics and Gynecology

## 2016-02-04 VITALS — BP 118/76 | HR 74 | Resp 16 | Ht 67.25 in | Wt 278.2 lb

## 2016-02-04 DIAGNOSIS — Z3009 Encounter for other general counseling and advice on contraception: Secondary | ICD-10-CM

## 2016-02-04 NOTE — Patient Instructions (Signed)
Diagnostic Laparoscopy A diagnostic laparoscopy is a procedure to diagnose diseases in the abdomen. During the procedure, a thin, lighted, pencil-sized instrument called a laparoscope is inserted into the abdomen through an incision. The laparoscope allows your health care provider to look at the organs inside your body. LET YOUR HEALTH CARE PROVIDER KNOW ABOUT:  Any allergies you have.  All medicines you are taking, including vitamins, herbs, eye drops, creams, and over-the-counter medicines.  Previous problems you or members of your family have had with the use of anesthetics.  Any blood disorders you have.  Previous surgeries you have had.  Medical conditions you have. RISKS AND COMPLICATIONS  Generally, this is a safe procedure. However, problems can occur, which may include:  Infection.  Bleeding.  Damage to other organs.  Allergic reaction to the anesthetics used during the procedure. BEFORE THE PROCEDURE  Do not eat or drink anything after midnight on the night before the procedure or as directed by your health care provider.  Ask your health care provider about:  Changing or stopping your regular medicines.  Taking medicines such as aspirin and ibuprofen. These medicines can thin your blood. Do not take these medicines before your procedure if your health care provider instructs you not to.  Plan to have someone take you home after the procedure. PROCEDURE  You may be given a medicine to help you relax (sedative).  You will be given a medicine to make you sleep (general anesthetic).  Your abdomen will be inflated with a gas. This will make your organs easier to see.  Small incisions will be made in your abdomen.  A laparoscope and other small instruments will be inserted into the abdomen through the incisions.  A tissue sample may be removed from an organ in the abdomen for examination.  The instruments will be removed from the abdomen.  The gas will be  released.  The incisions will be closed with stitches (sutures). AFTER THE PROCEDURE  Your blood pressure, heart rate, breathing rate, and blood oxygen level will be monitored often until the medicines you were given have worn off.   This information is not intended to replace advice given to you by your health care provider. Make sure you discuss any questions you have with your health care provider.   Document Released: 09/01/2000 Document Revised: 02/14/2015 Document Reviewed: 01/06/2014 Elsevier Interactive Patient Education 2016 [employer] Inc[employer]  

## 2016-02-04 NOTE — Progress Notes (Signed)
GYNECOLOGY  VISIT   HPI: 30 y.o.   Legally Separated  Caucasian  female   G2P2002 with Patient's last menstrual period was 01/21/2016.   here for consult for BTL.    Prior to OCP's her cycles were q 30-35 days x 7 day with heavy flow, saturating a tampon in 2 hours. No significant cramps. Kids are 3 and 6 kids.   GYNECOLOGIC HISTORY: Patient's last menstrual period was 01/21/2016. Contraception:OCP's  Menopausal hormone therapy: none        OB History    Gravida Para Term Preterm AB Living   2 2 2     2    SAB TAB Ectopic Multiple Live Births           2         Patient Active Problem List   Diagnosis Date Noted  . Weight gain 12/21/2015  . Irregular menses 12/21/2015    Past Medical History:  Diagnosis Date  . Abnormal Pap smear of cervix ~2005   colpo biopsy  . Anxiety   . Depression   . Hemorrhage after delivery of fetus 2011    Past Surgical History:  Procedure Laterality Date  . APPENDECTOMY  2003  . BREAST SURGERY Bilateral 11/14/2002   Breast Reduction   . COLPOSCOPY  ~2005   Normal - per pt  . WISDOM TOOTH EXTRACTION    Left knee surgery  Current Outpatient Prescriptions  Medication Sig Dispense Refill  . levonorgestrel-ethinyl estradiol (NORDETTE) 0.15-30 MG-MCG tablet Take 1 tablet by mouth daily. 3 Package 3  . Multiple Vitamin (MULTIVITAMIN) tablet Take 1 tablet by mouth daily.     No current facility-administered medications for this visit.      ALLERGIES: Latex  Family History  Problem Relation Age of Onset  . Adopted: Yes    Social History   Social History  . Marital status: Legally Separated    Spouse name: N/A  . Number of children: N/A  . Years of education: N/A   Occupational History  . Not on file.   Social History Main Topics  . Smoking status: Former Smoker    Packs/day: 0.25    Years: 7.00    Quit date: 07/26/2014  . Smokeless tobacco: Never Used  . Alcohol use 0.6 - 1.2 oz/week    1 - 2 Glasses of wine per week  .  Drug use: No  . Sexual activity: Not Currently    Birth control/ protection: Abstinence   Other Topics Concern  . Not on file   Social History Narrative  . No narrative on file    Review of Systems  Constitutional: Negative.   HENT: Negative.   Eyes: Negative.   Respiratory: Negative.   Cardiovascular: Negative.   Gastrointestinal: Negative.   Genitourinary: Negative.   Musculoskeletal: Negative.   Skin: Negative.   Neurological: Negative.   Endo/Heme/Allergies: Negative.   Psychiatric/Behavioral: Negative.     PHYSICAL EXAMINATION:    BP 118/76 (BP Location: Right Arm, Patient Position: Sitting, Cuff Size: Large)   Pulse 74   Resp 16   Ht 5' 7.25" (1.708 m)   Wt 278 lb 3.2 oz (126.2 kg)   LMP 01/21/2016   BMI 43.25 kg/m     General appearance: alert, cooperative and appears stated age Neck: no adenopathy, supple, symmetrical, trachea midline and thyroid normal to inspection and palpation Heart: regular rate and rhythm Lungs: CTAB Abdomen: soft, non-tender; bowel sounds normal; no masses,  no organomegaly Lungs: CTAB Extremities: normal,  atraumatic, no cyanosis Skin: normal color, texture and turgor, no rashes or lesions Lymph: normal cervical supraclavicular and inguinal nodes Neurologic: grossly normal   ASSESSMENT Desires sterilization   PLAN Discussed options of essure, laparoscopic tubal ligation with clips and laparoscopic bilateral salpingectomy, risks and benefits of each reviewed She desires laparoscopic bilateral salpingectomy Risks of bleeding, infection, damage to bowel/bladder/vessels and ureters reviewed with the patient and her mother   An After Visit Summary was printed and given to the patient.  20 minutes face to face time of which over 50% was spent in counseling.

## 2016-02-05 ENCOUNTER — Telehealth: Payer: Self-pay | Admitting: *Deleted

## 2016-02-05 ENCOUNTER — Telehealth: Payer: Self-pay | Admitting: Obstetrics and Gynecology

## 2016-02-05 NOTE — Telephone Encounter (Signed)
Call to patient. Surgery date options discussed and patient requests to proceed with 02-28-16 if available.   Surgery scheduled and confirmed. Call back to patient and advised surgery is scheduled for 0730 and [facility]. Surgery instruction sheet reviewed and printed copy mailed to patient.  Advised to call if has any questions prior to procedure.  Routing to provider for final review. Patient agreeable to disposition. Will close encounter.

## 2016-02-05 NOTE — Telephone Encounter (Signed)
Spoke with pt regarding benefit for surgery. Patient understood and agreeable. Patient ready to schedule. Patient aware this is professional benefit only. Patient aware will be contacted by hospital for separate benefits. Staff message to Amanda Davis for scheduling °

## 2016-02-05 NOTE — Telephone Encounter (Signed)
Called patient to review benefits for a recommended procedure. Left Voicemail requesting a call back. °

## 2016-02-12 ENCOUNTER — Encounter (HOSPITAL_COMMUNITY): Payer: Self-pay | Admitting: *Deleted

## 2016-02-15 NOTE — H&P (Signed)
GYNECOLOGY  VISIT   HPI: 30 y.o.   Legally Separated  Caucasian  female   G2P2002 with Patient's last menstrual period was 01/21/2016.   here for consult for BTL.    Prior to OCP's her cycles were q 30-35 days x 7 day with heavy flow, saturating a tampon in 2 hours. No significant cramps. Kids are 3 and 6.   GYNECOLOGIC HISTORY: Patient's last menstrual period was 01/21/2016. Contraception:OCP's  Menopausal hormone therapy: none                OB History    Gravida Para Term Preterm AB Living   2 2 2     2    SAB TAB Ectopic Multiple Live Births           2             Patient Active Problem List   Diagnosis Date Noted  . Weight gain 12/21/2015  . Irregular menses 12/21/2015        Past Medical History:  Diagnosis Date  . Abnormal Pap smear of cervix ~2005   colpo biopsy  . Anxiety   . Depression   . Hemorrhage after delivery of fetus 2011         Past Surgical History:  Procedure Laterality Date  . APPENDECTOMY  2003  . BREAST SURGERY Bilateral 11/14/2002   Breast Reduction   . COLPOSCOPY  ~2005   Normal - per pt  . WISDOM TOOTH EXTRACTION    Left knee surgery        Current Outpatient Prescriptions  Medication Sig Dispense Refill  . levonorgestrel-ethinyl estradiol (NORDETTE) 0.15-30 MG-MCG tablet Take 1 tablet by mouth daily. 3 Package 3  . Multiple Vitamin (MULTIVITAMIN) tablet Take 1 tablet by mouth daily.     No current facility-administered medications for this visit.      ALLERGIES: Latex       Family History  Problem Relation Age of Onset  . Adopted: Yes    Social History        Social History  . Marital status: Legally Separated    Spouse name: N/A  . Number of children: N/A  . Years of education: N/A      Occupational History  . Not on file.        Social History Main Topics  . Smoking status: Former Smoker    Packs/day: 0.25    Years: 7.00    Quit date: 07/26/2014  . Smokeless  tobacco: Never Used  . Alcohol use 0.6 - 1.2 oz/week    1 - 2 Glasses of wine per week  . Drug use: No  . Sexual activity: Not Currently    Birth control/ protection: Abstinence       Other Topics Concern  . Not on file      Social History Narrative  . No narrative on file    Review of Systems  Constitutional: Negative.   HENT: Negative.   Eyes: Negative.   Respiratory: Negative.   Cardiovascular: Negative.   Gastrointestinal: Negative.   Genitourinary: Negative.   Musculoskeletal: Negative.   Skin: Negative.   Neurological: Negative.   Endo/Heme/Allergies: Negative.   Psychiatric/Behavioral: Negative.     PHYSICAL EXAMINATION:    BP 118/76 (BP Location: Right Arm, Patient Position: Sitting, Cuff Size: Large)   Pulse 74   Resp 16   Ht 5' 7.25" (1.708 m)   Wt 278 lb 3.2 oz (126.2 kg)   LMP 01/21/2016  BMI 43.25 kg/m     General appearance: alert, cooperative and appears stated age Neck: no adenopathy, supple, symmetrical, trachea midline and thyroid normal to inspection and palpation Heart: regular rate and rhythm Lungs: CTAB Abdomen: soft, non-tender; bowel sounds normal; no masses,  no organomegaly Lungs: CTAB Extremities: normal, atraumatic, no cyanosis Skin: normal color, texture and turgor, no rashes or lesions Lymph: normal cervical supraclavicular and inguinal nodes Neurologic: grossly normal   ASSESSMENT Desires sterilization   PLAN Discussed options of essure, laparoscopic tubal ligation with clips and laparoscopic bilateral salpingectomy, risks and benefits of each reviewed She desires laparoscopic bilateral salpingectomy Risks of bleeding, infection, damage to bowel/bladder/vessels and ureters reviewed with the patient and her mother   An After Visit Summary was printed and given to the patient.

## 2016-02-27 NOTE — Anesthesia Preprocedure Evaluation (Signed)
Anesthesia Evaluation  Patient identified by MRN, date of birth, ID band Patient awake    Reviewed: Allergy & Precautions, NPO status , Patient's Chart, lab work & pertinent test results  History of Anesthesia Complications Negative for: history of anesthetic complications  Airway Mallampati: II  TM Distance: >3 FB Neck ROM: Full    Dental no notable dental hx. (+) Dental Advisory Given   Pulmonary former smoker,    Pulmonary exam normal breath sounds clear to auscultation       Cardiovascular negative cardio ROS Normal cardiovascular exam Rhythm:Regular Rate:Normal     Neuro/Psych negative neurological ROS  negative psych ROS   GI/Hepatic negative GI ROS, Neg liver ROS,   Endo/Other  Morbid obesity  Renal/GU negative Renal ROS  negative genitourinary   Musculoskeletal negative musculoskeletal ROS (+)   Abdominal   Peds negative pediatric ROS (+)  Hematology negative hematology ROS (+)   Anesthesia Other Findings   Reproductive/Obstetrics negative OB ROS                             Anesthesia Physical Anesthesia Plan  ASA: III  Anesthesia Plan: General   Post-op Pain Management:    Induction: Intravenous  Airway Management Planned: Oral ETT  Additional Equipment:   Intra-op Plan:   Post-operative Plan: Extubation in OR  Informed Consent: I have reviewed the patients History and Physical, chart, labs and discussed the procedure including the risks, benefits and alternatives for the proposed anesthesia with the patient or authorized representative who has indicated his/her understanding and acceptance.   Dental advisory given  Plan Discussed with: CRNA  Anesthesia Plan Comments:         Anesthesia Quick Evaluation

## 2016-02-28 ENCOUNTER — Ambulatory Visit (HOSPITAL_COMMUNITY)
Admission: RE | Admit: 2016-02-28 | Discharge: 2016-02-28 | Disposition: A | Discharge status: Home / Self Care | Payer: 59 | Source: Ambulatory Visit | Attending: Obstetrics and Gynecology | Admitting: Obstetrics and Gynecology

## 2016-02-28 ENCOUNTER — Encounter (HOSPITAL_COMMUNITY)
Admission: RE | Disposition: A | Discharge status: Home / Self Care | Payer: Self-pay | Source: Ambulatory Visit | Attending: Obstetrics and Gynecology

## 2016-02-28 ENCOUNTER — Ambulatory Visit (HOSPITAL_COMMUNITY): Payer: 59 | Admitting: Anesthesiology

## 2016-02-28 ENCOUNTER — Encounter (HOSPITAL_COMMUNITY): Payer: Self-pay | Admitting: Emergency Medicine

## 2016-02-28 DIAGNOSIS — Z87891 Personal history of nicotine dependence: Secondary | ICD-10-CM | POA: Insufficient documentation

## 2016-02-28 DIAGNOSIS — Z302 Encounter for sterilization: Secondary | ICD-10-CM | POA: Insufficient documentation

## 2016-02-28 DIAGNOSIS — Z6841 Body Mass Index (BMI) 40.0 and over, adult: Secondary | ICD-10-CM | POA: Diagnosis not present

## 2016-02-28 HISTORY — PX: LAPAROSCOPIC BILATERAL SALPINGECTOMY: SHX5889

## 2016-02-28 LAB — CBC
HCT: 37.5 % (ref 36.0–46.0)
HEMOGLOBIN: 12.5 g/dL (ref 12.0–15.0)
MCH: 28.9 pg (ref 26.0–34.0)
MCHC: 33.3 g/dL (ref 30.0–36.0)
MCV: 86.8 fL (ref 78.0–100.0)
PLATELETS: 294 10*3/uL (ref 150–400)
RBC: 4.32 MIL/uL (ref 3.87–5.11)
RDW: 13.4 % (ref 11.5–15.5)
WBC: 7.6 10*3/uL (ref 4.0–10.5)

## 2016-02-28 LAB — PREGNANCY, URINE: Preg Test, Ur: NEGATIVE

## 2016-02-28 SURGERY — SALPINGECTOMY, BILATERAL, LAPAROSCOPIC
Anesthesia: General

## 2016-02-28 MED ORDER — LIDOCAINE HCL (CARDIAC) 20 MG/ML IV SOLN
INTRAVENOUS | Status: DC | PRN
  Administered 2016-02-28: 100 mg via INTRAVENOUS

## 2016-02-28 MED ORDER — FENTANYL CITRATE (PF) 100 MCG/2ML IJ SOLN
INTRAMUSCULAR | Status: AC
  Filled 2016-02-28: qty 2

## 2016-02-28 MED ORDER — MIDAZOLAM HCL 2 MG/2ML IJ SOLN
INTRAMUSCULAR | Status: DC | PRN
  Administered 2016-02-28: 2 mg via INTRAVENOUS

## 2016-02-28 MED ORDER — DEXAMETHASONE SODIUM PHOSPHATE 4 MG/ML IJ SOLN
INTRAMUSCULAR | Status: AC
  Filled 2016-02-28: qty 1

## 2016-02-28 MED ORDER — MIDAZOLAM HCL 2 MG/2ML IJ SOLN
INTRAMUSCULAR | Status: AC
  Filled 2016-02-28: qty 2

## 2016-02-28 MED ORDER — SUGAMMADEX SODIUM 200 MG/2ML IV SOLN
INTRAVENOUS | Status: DC | PRN
  Administered 2016-02-28: 245.8 mg via INTRAVENOUS

## 2016-02-28 MED ORDER — OXYCODONE HCL 5 MG PO TABS: 5.0000 mg | ORAL_TABLET | Freq: Once | ORAL | Status: DC | PRN

## 2016-02-28 MED ORDER — FENTANYL CITRATE (PF) 100 MCG/2ML IJ SOLN
INTRAMUSCULAR | Status: DC | PRN
  Administered 2016-02-28 (×2): 25 ug via INTRAVENOUS
  Administered 2016-02-28: 100 ug via INTRAVENOUS
  Administered 2016-02-28: 50 ug via INTRAVENOUS

## 2016-02-28 MED ORDER — LACTATED RINGERS IV SOLN
INTRAVENOUS | Status: DC
  Administered 2016-02-28: 07:00:00 via INTRAVENOUS

## 2016-02-28 MED ORDER — BUPIVACAINE HCL (PF) 0.25 % IJ SOLN
INTRAMUSCULAR | Status: AC
  Filled 2016-02-28: qty 30

## 2016-02-28 MED ORDER — ROCURONIUM BROMIDE 100 MG/10ML IV SOLN
INTRAVENOUS | Status: AC
  Filled 2016-02-28: qty 1

## 2016-02-28 MED ORDER — KETOROLAC TROMETHAMINE 30 MG/ML IJ SOLN
INTRAMUSCULAR | Status: DC | PRN
  Administered 2016-02-28: 30 mg via INTRAVENOUS

## 2016-02-28 MED ORDER — LIDOCAINE HCL (CARDIAC) 20 MG/ML IV SOLN
INTRAVENOUS | Status: AC
  Filled 2016-02-28: qty 5

## 2016-02-28 MED ORDER — BUPIVACAINE HCL (PF) 0.25 % IJ SOLN
INTRAMUSCULAR | Status: DC | PRN
  Administered 2016-02-28: 8 mL

## 2016-02-28 MED ORDER — OXYCODONE HCL 5 MG/5ML PO SOLN: 5.0000 mg | Freq: Once | ORAL | Status: DC | PRN

## 2016-02-28 MED ORDER — IBUPROFEN 800 MG PO TABS: 800.0000 mg | ORAL_TABLET | Freq: Three times a day (TID) | ORAL | 0 refills | Status: DC | PRN

## 2016-02-28 MED ORDER — SCOPOLAMINE 1 MG/3DAYS TD PT72
1.0000 | MEDICATED_PATCH | Freq: Once | TRANSDERMAL | Status: DC
  Administered 2016-02-28: 1.5 mg via TRANSDERMAL

## 2016-02-28 MED ORDER — PHENYLEPHRINE HCL 10 MG/ML IJ SOLN
INTRAMUSCULAR | Status: DC | PRN
  Administered 2016-02-28: 80 ug via INTRAVENOUS

## 2016-02-28 MED ORDER — ONDANSETRON HCL 4 MG/2ML IJ SOLN
4.0000 mg | Freq: Once | INTRAMUSCULAR | Status: DC | PRN
Start: 2016-02-28 — End: 2016-02-28

## 2016-02-28 MED ORDER — ONDANSETRON HCL 4 MG/2ML IJ SOLN
INTRAMUSCULAR | Status: AC
  Filled 2016-02-28: qty 2

## 2016-02-28 MED ORDER — DEXAMETHASONE SODIUM PHOSPHATE 10 MG/ML IJ SOLN
INTRAMUSCULAR | Status: DC | PRN
  Administered 2016-02-28: 4 mg via INTRAVENOUS

## 2016-02-28 MED ORDER — LACTATED RINGERS IV SOLN: INTRAVENOUS | Status: DC

## 2016-02-28 MED ORDER — ROCURONIUM BROMIDE 100 MG/10ML IV SOLN
INTRAVENOUS | Status: DC | PRN
  Administered 2016-02-28: 40 mg via INTRAVENOUS

## 2016-02-28 MED ORDER — ONDANSETRON HCL 4 MG/2ML IJ SOLN
INTRAMUSCULAR | Status: DC | PRN
  Administered 2016-02-28: 4 mg via INTRAVENOUS

## 2016-02-28 MED ORDER — PROMETHAZINE HCL 12.5 MG PO TABS: ORAL_TABLET | ORAL | 0 refills | Status: DC

## 2016-02-28 MED ORDER — FENTANYL CITRATE (PF) 100 MCG/2ML IJ SOLN: 25.0000 ug | INTRAMUSCULAR | Status: DC | PRN

## 2016-02-28 MED ORDER — OXYCODONE-ACETAMINOPHEN 5-325 MG PO TABS: 1.0000 | ORAL_TABLET | ORAL | 0 refills | Status: DC | PRN

## 2016-02-28 MED ORDER — SCOPOLAMINE 1 MG/3DAYS TD PT72
MEDICATED_PATCH | TRANSDERMAL | Status: AC
  Administered 2016-02-28: 1.5 mg via TRANSDERMAL
  Filled 2016-02-28: qty 1

## 2016-02-28 MED ORDER — PROPOFOL 10 MG/ML IV BOLUS
INTRAVENOUS | Status: DC | PRN
Start: 2016-02-28 — End: 2016-02-28
  Administered 2016-02-28: 200 mg via INTRAVENOUS

## 2016-02-28 MED ORDER — PROPOFOL 10 MG/ML IV BOLUS
INTRAVENOUS | Status: AC
  Filled 2016-02-28: qty 20

## 2016-02-28 SURGICAL SUPPLY — 32 items
APPLICATOR ARISTA FLEXITIP XL (MISCELLANEOUS) IMPLANT
BENZOIN TINCTURE PRP APPL 2/3 (GAUZE/BANDAGES/DRESSINGS) IMPLANT
CABLE HIGH FREQUENCY MONO STRZ (ELECTRODE) IMPLANT
CANISTER SUCT 3000ML (MISCELLANEOUS) ×2 IMPLANT
CLOTH BEACON ORANGE TIMEOUT ST (SAFETY) ×2 IMPLANT
DRSG OPSITE POSTOP 3X4 (GAUZE/BANDAGES/DRESSINGS) IMPLANT
DURAPREP 26ML APPLICATOR (WOUND CARE) ×2 IMPLANT
GLOVE BIO SURGEON STRL SZ 6.5 (GLOVE) ×4 IMPLANT
GLOVE BIOGEL PI IND STRL 7.0 (GLOVE) ×2 IMPLANT
GLOVE BIOGEL PI INDICATOR 7.0 (GLOVE) ×2
GOWN STRL REUS W/TWL LRG LVL3 (GOWN DISPOSABLE) ×4 IMPLANT
HEMOSTAT ARISTA ABSORB 3G PWDR (MISCELLANEOUS) IMPLANT
LIGASURE VESSEL 5MM BLUNT TIP (ELECTROSURGICAL) ×2 IMPLANT
LIQUID BAND (GAUZE/BANDAGES/DRESSINGS) IMPLANT
NEEDLE INSUFFLATION 120MM (ENDOMECHANICALS) ×2 IMPLANT
NS IRRIG 1000ML POUR BTL (IV SOLUTION) ×2 IMPLANT
PACK LAPAROSCOPY BASIN (CUSTOM PROCEDURE TRAY) ×2 IMPLANT
PAD TRENDELENBURG POSITION (MISCELLANEOUS) ×2 IMPLANT
PROTECTOR NERVE ULNAR (MISCELLANEOUS) ×4 IMPLANT
SCISSORS LAP 5X35 DISP (ENDOMECHANICALS) IMPLANT
SET CYSTO W/LG BORE CLAMP LF (SET/KITS/TRAYS/PACK) IMPLANT
SET IRRIG TUBING LAPAROSCOPIC (IRRIGATION / IRRIGATOR) IMPLANT
SHEARS HARMONIC ACE PLUS 36CM (ENDOMECHANICALS) IMPLANT
SLEEVE XCEL OPT CAN 5 100 (ENDOMECHANICALS) ×2 IMPLANT
SUT VICRYL 4-0 PS2 18IN ABS (SUTURE) ×2 IMPLANT
SYSTEM CARTER THOMASON II (TROCAR) IMPLANT
TOWEL OR 17X24 6PK STRL BLUE (TOWEL DISPOSABLE) ×4 IMPLANT
TRAY FOLEY CATH SILVER 14FR (SET/KITS/TRAYS/PACK) IMPLANT
TROCAR XCEL NON BLADE 8MM B8LT (ENDOMECHANICALS) ×2 IMPLANT
TROCAR XCEL NON-BLD 5MMX100MML (ENDOMECHANICALS) ×2 IMPLANT
WARMER LAPAROSCOPE (MISCELLANEOUS) ×2 IMPLANT
WATER STERILE IRR 1000ML POUR (IV SOLUTION) ×2 IMPLANT

## 2016-02-28 NOTE — Discharge Instructions (Signed)
DISCHARGE INSTRUCTIONS: Laparoscopy  The following instructions have been prepared to help you care for yourself upon your return home today.  Wound care:  Do not get the incision wet for the first 24 hours. The incision should be kept clean and dry.  The Band-Aids or dressings may be removed the day after surgery.  Should the incision become sore, red, and swollen after the first week, check with your doctor.  Personal hygiene:  Shower the day after your procedure.  Activity and limitations:  Do NOT drive or operate any equipment today.  Do NOT lift anything more than 15 pounds for 2-3 weeks after surgery.  Do NOT rest in bed all day.  Walking is encouraged. Walk each day, starting slowly with 5-minute walks 3 or 4 times a day. Slowly increase the length of your walks.  Walk up and down stairs slowly.  Do NOT do strenuous activities, such as golfing, playing tennis, bowling, running, biking, weight lifting, gardening, mowing, or vacuuming for 2-4 weeks. Ask your doctor when it is okay to start.  Diet: Eat a light meal as desired this evening. You may resume your usual diet tomorrow.  Return to work: This is dependent on the type of work you do. For the most part you can return to a desk job within a week of surgery. If you are more active at work, please discuss this with your doctor.  What to expect after your surgery: You may have a slight burning sensation when you urinate on the first day. You may have a very small amount of blood in the urine. Expect to have a small amount of vaginal discharge/light bleeding for 1-2 weeks. It is not unusual to have abdominal soreness and bruising for up to 2 weeks. You may be tired and need more rest for about 1 week. You may experience shoulder pain for 24-72 hours. Lying flat in bed may relieve it.  Call your doctor for any of the following:  Develop a fever of 100.4 or greater  Inability to urinate 6 hours after discharge from hospital  Severe  pain not relieved by pain medications  Persistent of heavy bleeding at incision site  Redness or swelling around incision site after a week  Increasing nausea or vomiting  Patient Signature________________________________________ Nurse Signature_________________________________________ Post Anesthesia Home Care Instructions  Activity: Get plenty of rest for the remainder of the day. A responsible adult should stay with you for 24 hours following the procedure.  For the next 24 hours, DO NOT: -Drive a car -Operate machinery -Drink alcoholic beverages -Take any medication unless instructed by your physician -Make any legal decisions or sign important papers.  Meals: Start with liquid foods such as gelatin or soup. Progress to regular foods as tolerated. Avoid greasy, spicy, heavy foods. If nausea and/or vomiting occur, drink only clear liquids until the nausea and/or vomiting subsides. Call your physician if vomiting continues.  Special Instructions/Symptoms: Your throat may feel dry or sore from the anesthesia or the breathing tube placed in your throat during surgery. If this causes discomfort, gargle with warm salt water. The discomfort should disappear within 24 hours.  If you had a scopolamine patch placed behind your ear for the management of post- operative nausea and/or vomiting:  1. The medication in the patch is effective for 72 hours, after which it should be removed.  Wrap patch in a tissue and discard in the trash. Wash hands thoroughly with soap and water. 2. You may remove the patch   earlier than 72 hours if you experience unpleasant side effects which may include dry mouth, dizziness or visual disturbances. 3. Avoid touching the patch. Wash your hands with soap and water after contact with the patch.   

## 2016-02-28 NOTE — Transfer of Care (Signed)
Immediate Anesthesia Transfer of Care Note  Patient: Amanda Davis  Procedure(s) Performed: Procedure(s) with comments: LAPAROSCOPIC BILATERAL SALPINGECTOMY (N/A) - Beth RNFA  Patient Location: PACU  Anesthesia Type:General  Level of Consciousness: awake, alert  and oriented  Airway & Oxygen Therapy: Patient Spontanous Breathing and Patient connected to nasal cannula oxygen  Post-op Assessment: Report given to RN and Post -op Vital signs reviewed and stable  Post vital signs: Reviewed and stable  Last Vitals:  Vitals:   02/28/16 0645  BP: 139/82  Pulse: 81  Resp: 16  Temp: 36.7 C    Last Pain:  Vitals:   02/28/16 0645  TempSrc: Oral      Patients Stated Pain Goal: 4 (02/28/16 0645)  Complications: No apparent anesthesia complications

## 2016-02-28 NOTE — Anesthesia Procedure Notes (Addendum)
Procedure Name: Intubation Date/Time: 02/28/2016 7:30 AM Performed by: STERLING, Jannet AskewHARLESETTA M Pre-anesthesia Checklist: Patient identified, Patient being monitored, Emergency Drugs available, Suction available and Timeout performed Patient Re-evaluated:Patient Re-evaluated prior to inductionOxygen Delivery Method: Circle system utilized Preoxygenation: Pre-oxygenation with 100% oxygen Intubation Type: IV induction Ventilation: Mask ventilation without difficulty Laryngoscope Size: Mac and 3 Grade View: Grade II Tube type: Oral Number of attempts: 2 Airway Equipment and Method: Patient positioned with wedge pillow Placement Confirmation: ETT inserted through vocal cords under direct vision and positive ETCO2 Secured at: 21 cm Comments: First attempt by CRNA and reported a grade IV view. MDA took over and easily placed ETT on first attempt with a grade II view. The patient was very easy to mask ventilate.

## 2016-02-28 NOTE — Anesthesia Postprocedure Evaluation (Signed)
Anesthesia Post Note  Patient: Sharlene MottsJuliane E Parfitt  Procedure(s) Performed: Procedure(s) (LRB): LAPAROSCOPIC BILATERAL SALPINGECTOMY (N/A)  Patient location during evaluation: PACU Anesthesia Type: General Level of consciousness: awake and alert Pain management: pain level controlled Vital Signs Assessment: post-procedure vital signs reviewed and stable Respiratory status: spontaneous breathing, nonlabored ventilation, respiratory function stable and patient connected to nasal cannula oxygen Cardiovascular status: blood pressure returned to baseline and stable Postop Assessment: no signs of nausea or vomiting Anesthetic complications: no     Last Vitals:  Vitals:   02/28/16 0845 02/28/16 0915  BP: 113/70 108/70  Pulse: 77 62  Resp: 16 16  Temp:  36.7 C    Last Pain:  Vitals:   02/28/16 0645  TempSrc: Oral   Pain Goal: Patients Stated Pain Goal: 4 (02/28/16 0645)               Lissandro Dilorenzo, Efrain SellaMARY JENNETTE

## 2016-02-28 NOTE — Op Note (Signed)
Preoperative Diagnosis: Desires permanent sterilization  Postoperative Diagnosis: Same   Procedure:  Laparoscopic bilateral salpingectomies  Surgeon: Dr Gertie ExonJill Jertson  Assistant: Gwinda PasseBeth Olsen, 1st assist  Anesthesia: General  EBL: 5 cc  Fluids: 1,000 cc LR  Urine output: 30 cc  Complications: none  Indications for surgery: The patient is a 30 year old female, who presented desiring sterilization. All of her options were reviewed and she desired bilateral salpingectomies The patient is aware of the risks and complications involved with the surgery and consent was obtained prior to the procedure.  Findings: Normal uterus and bilateral adnexa, normal liver edge, no adhesions  Procedure: The patient was taken to the operating room with an IV in placed. She was placed in the dorsal lithotomy position. General anesthesia was administered. She was prepped and draped in the usual sterile fashion for an abdominal, vaginal surgery. A hulka uterine manipulator was placed. A foley catheter was placed.   The umbilicus was everted, injected with 0.25% marcaine and incised with a # 11 blade. 2 towel clips were used to elevated the umbilicus and a veress needle was placed into the abdominal cavity. The abdominal cavity was insufflated with CO2, with normal intraabdominal pressures. After adequate pneumo-insufflation the veress needle was removed and the 5 mm laparoscope was placed into the abdominal cavity using the opti-view trocar. The patient was placed in trendelenburg and the abdominal pelvic cavity was inspected. 2 more trocars were placed. 1 in the left  lower quadrant approximately 3 cm medial to the anterior superior iliac spine, and one approximately 5 cm superior to the pubic symphysis in the midline. These areas were injected with 0.25% marcaine, incised with a #11 blades. A 5 mm trocar was inserted in the left lower quadrant and an 8 mm trocar in the midline. Both were inserted with direct  visualization with the laparoscope. The abdominal pelvic cavity was again inspected.   The fallopian tube were both removed using the ligasure device. Hemostasis was excellent. Intraabdominal pressure was reduce, hemostasis again was excellent.   The abdominal cavity was desufflated and the trocars were removed. The skin was closed with subcuticular stiches of 4-0 vicryl and dermabond was placed over the incisions. The umbilical incision was only closed with dermabond.   The hulka tenaculum and foley catheter were removed.   The patient's abdomen and perineum were cleansed and she was taken out of the dorsal lithotomy position. Upon awakening she was extubated and taken to the recovery room in stable condition. The sponge and instrument counts were correct.

## 2016-02-28 NOTE — Interval H&P Note (Signed)
History and Physical Interval Note:  02/28/2016 7:15 AM  Amanda MottsJuliane E Vitiello  has presented today for surgery, with the diagnosis of DESIRES STERILIZATION  The various methods of treatment have been discussed with the patient and family. After consideration of risks, benefits and other options for treatment, the patient has consented to  Procedure(s) with comments: LAPAROSCOPIC BILATERAL SALPINGECTOMY (N/A) - Beth RNFA as a surgical intervention .  The patient's history has been reviewed, patient examined, no change in status, stable for surgery.  I have reviewed the patient's chart and labs.  Questions were answered to the patient's satisfaction.     Romualdo BolkJill Evelyn Cormick Moss

## 2016-02-29 ENCOUNTER — Encounter (HOSPITAL_COMMUNITY): Payer: Self-pay | Admitting: Obstetrics and Gynecology

## 2016-03-12 ENCOUNTER — Encounter: Payer: Self-pay | Admitting: Obstetrics and Gynecology

## 2016-03-12 ENCOUNTER — Ambulatory Visit (INDEPENDENT_AMBULATORY_CARE_PROVIDER_SITE_OTHER): Payer: 59 | Admitting: Obstetrics and Gynecology

## 2016-03-12 VITALS — BP 104/70 | HR 76 | Resp 14 | Wt 273.0 lb

## 2016-03-12 DIAGNOSIS — Z9889 Other specified postprocedural states: Secondary | ICD-10-CM

## 2016-03-12 NOTE — Progress Notes (Signed)
GYNECOLOGY  VISIT   HPI: 30 y.o.   Legally Separated  Caucasian  female   G2P2002 with Patient's last menstrual period was 02/15/2016.   here for follow up 13 days S/P LAPAROSCOPIC BILATERAL SALPINGECTOMY. Doing well, no c/o. Feeling fine.   GYNECOLOGIC HISTORY: Patient's last menstrual period was 02/15/2016. Contraception:Tubal Menopausal hormone therapy: none        OB History    Gravida Para Term Preterm AB Living   2 2 2     2    SAB TAB Ectopic Multiple Live Births           2         Patient Active Problem List   Diagnosis Date Noted  . Weight gain 12/21/2015  . Irregular menses 12/21/2015    Past Medical History:  Diagnosis Date  . Abnormal Pap smear of cervix ~2005   colpo biopsy  . Anxiety   . Depression   . Hemorrhage after delivery of fetus 2011  . SVD (spontaneous vaginal delivery)    x 2    Past Surgical History:  Procedure Laterality Date  . APPENDECTOMY  2003  . BREAST SURGERY Bilateral 11/14/2002   Breast Reduction   . COLPOSCOPY  ~2005   Normal - per pt  . KNEE SURGERY     left  . LAPAROSCOPIC BILATERAL SALPINGECTOMY N/A 02/28/2016   Procedure: LAPAROSCOPIC BILATERAL SALPINGECTOMY;  Surgeon: Romualdo Bolk, MD;  Location: [facility];  Service: Gynecology;  Laterality: N/A;  Beth RNFA  . TUBAL LIGATION    . WISDOM TOOTH EXTRACTION      Current Outpatient Prescriptions  Medication Sig Dispense Refill  . aspirin-acetaminophen-caffeine (EXCEDRIN MIGRAINE) 250-250-65 MG tablet Take 2 tablets by mouth every 6 (six) hours as needed for headache.    . Multiple Vitamin (MULTIVITAMIN) tablet Take 1 tablet by mouth daily.     No current facility-administered medications for this visit.      ALLERGIES: Latex  Family History  Problem Relation Age of Onset  . Adopted: Yes    Social History   Social History  . Marital status: Legally Separated    Spouse name: N/A  . Number of children: N/A  . Years of education: N/A   Occupational History  .  Not on file.   Social History Main Topics  . Smoking status: Former Smoker    Packs/day: 0.25    Years: 7.00    Types: Cigarettes    Quit date: 07/26/2014  . Smokeless tobacco: Never Used  . Alcohol use 0.6 - 1.2 oz/week    1 - 2 Glasses of wine per week  . Drug use: No  . Sexual activity: Yes    Birth control/ protection: Pill   Other Topics Concern  . Not on file   Social History Narrative  . No narrative on file    Review of Systems  Constitutional: Negative.   HENT: Negative.   Eyes: Negative.   Respiratory: Negative.   Cardiovascular: Negative.   Gastrointestinal: Negative.   Genitourinary: Negative.   Musculoskeletal: Negative.   Skin: Negative.   Neurological: Negative.   Endo/Heme/Allergies: Negative.   Psychiatric/Behavioral: Negative.     PHYSICAL EXAMINATION:    BP 104/70 (BP Location: Right Arm, Patient Position: Sitting, Cuff Size: Normal)   Pulse 76   Resp 14   Wt 273 lb (123.8 kg)   LMP 02/15/2016   BMI 42.76 kg/m     General appearance: alert, cooperative and appears stated age Abdomen: soft,  not tender, not distended no masses,  no organomegaly Incisions: healing well   ASSESSMENT 2 weeks s/p laparoscopic bilateral salpingectomies, doing well    PLAN Routine f/u   An After Visit Summary was printed and given to the patient.

## 2016-12-08 ENCOUNTER — Telehealth: Payer: Self-pay | Admitting: Nurse Practitioner

## 2016-12-08 NOTE — Telephone Encounter (Signed)
Left message regarding upcoming appointment has been canceled and needs to be rescheduled. °

## 2016-12-24 NOTE — Progress Notes (Signed)
31 y.o. [id number] Legally SeparatedCaucasianF here for annual exam. Sexually active, same partner x 7 months (declines STD testing). No pain.  Period Cycle (Days): 30 Period Duration (Days): 5 Period Pattern: Regular Menstrual Flow: Heavy, Light Menstrual Control: Tampon Menstrual Control Change Freq (Hours): every 4 hours Dysmenorrhea: None  Patient's last menstrual period was 12/18/2016 (exact date).          Sexually active: Yes.    The current method of family planning is tubal ligation (s/p laparoscopic salpingectomies in 9/17).    Exercising: Yes.    cardio Smoker:  no  Health Maintenance: Pap:  12/15/14 Neg. HR HPV:neg  History of abnormal Pap:  Yes, CIN I 2005 MMG:  never TDaP:  2011   reports that she quit smoking about 2 years ago. Her smoking use included Cigarettes. She has a 1.75 pack-year smoking history. She has never used smokeless tobacco. She reports that she drinks about 0.6 - 1.2 oz of alcohol per week . She reports that she does not use drugs. She works at [employer]. Kids are 4 (girl) and 7 (boy).   Past Medical History:  Diagnosis Date  . Abnormal Pap smear of cervix ~2005   colpo biopsy  . Anxiety   . Depression   . Hemorrhage after delivery of fetus 2011  . SVD (spontaneous vaginal delivery)    x 2    Past Surgical History:  Procedure Laterality Date  . APPENDECTOMY  2003  . BREAST SURGERY Bilateral 11/14/2002   Breast Reduction   . COLPOSCOPY  ~2005   Normal - per pt  . KNEE SURGERY     left  . LAPAROSCOPIC BILATERAL SALPINGECTOMY N/A 02/28/2016   Procedure: LAPAROSCOPIC BILATERAL SALPINGECTOMY;  Surgeon: Romualdo Bolk, MD;  Location: [facility];  Service: Gynecology;  Laterality: N/A;  Beth RNFA  . TUBAL LIGATION    . WISDOM TOOTH EXTRACTION      Current Outpatient Prescriptions  Medication Sig Dispense Refill  . aspirin-acetaminophen-caffeine (EXCEDRIN MIGRAINE) 250-250-65 MG tablet Take 2 tablets by mouth every 6 (six) hours as needed  for headache.    . Multiple Vitamin (MULTIVITAMIN) tablet Take 1 tablet by mouth daily.     No current facility-administered medications for this visit.     Family History  Problem Relation Age of Onset  . Adopted: Yes    Review of Systems  Constitutional: Negative.   HENT: Negative.   Eyes: Negative.   Respiratory: Negative.   Cardiovascular: Negative.   Gastrointestinal: Negative.   Endocrine: Negative.   Genitourinary: Negative.   Musculoskeletal: Negative.   Skin: Negative.   Allergic/Immunologic: Negative.   Neurological: Negative.   Hematological: Negative.   Psychiatric/Behavioral: Negative.     Exam:   BP 120/80 (BP Location: Right Arm, Patient Position: Sitting, Cuff Size: Large)   Pulse 82   Resp 16   Ht 5' 8.25" (1.734 m)   Wt 294 lb (133.4 kg)   LMP 12/18/2016 (Exact Date)   BMI 44.38 kg/m   Weight change: @WEIGHTCHANGE @ Height:   Height: 5' 8.25" (173.4 cm)  Ht Readings from Last 3 Encounters:  12/25/16 5' 8.25" (1.734 m)  02/28/16 5\' 7"  (1.702 m)  02/04/16 5' 7.25" (1.708 m)    General appearance: alert, cooperative and appears stated age Head: Normocephalic, without obvious abnormality, atraumatic Neck: no adenopathy, supple, symmetrical, trachea midline and thyroid normal to inspection and palpation Lungs: clear to auscultation bilaterally Cardiovascular: regular rate and rhythm Breasts: normal appearance, no masses or  tenderness Abdomen: soft, non-tender; bowel sounds normal; no masses,  no organomegaly Extremities: extremities normal, atraumatic, no cyanosis or edema Skin: Skin color, texture, turgor normal. No rashes or lesions Lymph nodes: Cervical, supraclavicular, and axillary nodes normal. No abnormal inguinal nodes palpated Neurologic: Grossly normal   Pelvic: External genitalia:  no lesions              Urethra:  normal appearing urethra with no masses, tenderness or lesions              Bartholins and Skenes: normal                  Vagina: normal appearing vagina with normal color and discharge, no lesions              Cervix: no lesions               Bimanual Exam:  Uterus:  No masses, exam limited by BMI              Adnexa: no mass, fullness, tenderness               Rectovaginal: deferred, patient very tense with pelvic exam  Chaperone was present for exam.  A:  Well Woman with normal exam  P:   Pap next year  Declines STD testing  Discussed breast self awareness  Discussed calcium and vit D intake  TDAP UTD  Screening labs

## 2016-12-25 ENCOUNTER — Ambulatory Visit (INDEPENDENT_AMBULATORY_CARE_PROVIDER_SITE_OTHER): Payer: 59 | Admitting: Obstetrics and Gynecology

## 2016-12-25 ENCOUNTER — Encounter: Payer: Self-pay | Admitting: Obstetrics and Gynecology

## 2016-12-25 VITALS — BP 120/80 | HR 82 | Resp 16 | Ht 68.25 in | Wt 294.0 lb

## 2016-12-25 DIAGNOSIS — Z01419 Encounter for gynecological examination (general) (routine) without abnormal findings: Secondary | ICD-10-CM

## 2016-12-25 DIAGNOSIS — Z0000 Encounter for general adult medical examination without abnormal findings: Secondary | ICD-10-CM | POA: Diagnosis not present

## 2016-12-25 DIAGNOSIS — Z6841 Body Mass Index (BMI) 40.0 and over, adult: Secondary | ICD-10-CM | POA: Diagnosis not present

## 2016-12-25 NOTE — Patient Instructions (Signed)

## 2016-12-26 ENCOUNTER — Ambulatory Visit: Payer: 59 | Admitting: Nurse Practitioner

## 2016-12-26 LAB — CBC
Hematocrit: 40.2 % (ref 34.0–46.6)
Hemoglobin: 13 g/dL (ref 11.1–15.9)
MCH: 28.8 pg (ref 26.6–33.0)
MCHC: 32.3 g/dL (ref 31.5–35.7)
MCV: 89 fL (ref 79–97)
PLATELETS: 331 10*3/uL (ref 150–379)
RBC: 4.51 x10E6/uL (ref 3.77–5.28)
RDW: 13.4 % (ref 12.3–15.4)
WBC: 9.7 10*3/uL (ref 3.4–10.8)

## 2016-12-26 LAB — COMPREHENSIVE METABOLIC PANEL
A/G RATIO: 1.4 (ref 1.2–2.2)
ALT: 23 IU/L (ref 0–32)
AST: 18 IU/L (ref 0–40)
Albumin: 4.2 g/dL (ref 3.5–5.5)
Alkaline Phosphatase: 81 IU/L (ref 39–117)
BILIRUBIN TOTAL: 0.5 mg/dL (ref 0.0–1.2)
BUN/Creatinine Ratio: 13 (ref 9–23)
BUN: 9 mg/dL (ref 6–20)
CHLORIDE: 102 mmol/L (ref 96–106)
CO2: 22 mmol/L (ref 20–29)
Calcium: 9.2 mg/dL (ref 8.7–10.2)
Creatinine, Ser: 0.69 mg/dL (ref 0.57–1.00)
GFR calc Af Amer: 133 mL/min/{1.73_m2} (ref >59)
GFR calc non Af Amer: 116 mL/min/{1.73_m2} (ref >59)
GLUCOSE: 95 mg/dL (ref 65–99)
Globulin, Total: 2.9 g/dL (ref 1.5–4.5)
POTASSIUM: 4.4 mmol/L (ref 3.5–5.2)
Sodium: 140 mmol/L (ref 134–144)
Total Protein: 7.1 g/dL (ref 6.0–8.5)

## 2016-12-26 LAB — LIPID PANEL
CHOLESTEROL TOTAL: 192 mg/dL (ref 100–199)
Chol/HDL Ratio: 4.2 ratio (ref 0.0–4.4)
HDL: 46 mg/dL (ref >39)
LDL Calculated: 120 mg/dL — ABNORMAL HIGH (ref 0–99)
TRIGLYCERIDES: 128 mg/dL (ref 0–149)
VLDL Cholesterol Cal: 26 mg/dL (ref 5–40)

## 2016-12-26 LAB — HEMOGLOBIN A1C
ESTIMATED AVERAGE GLUCOSE: 111 mg/dL
Hgb A1c MFr Bld: 5.5 % (ref 4.8–5.6)

## 2016-12-26 LAB — TSH: TSH: 2.09 u[IU]/mL (ref 0.450–4.500)

## 2017-01-05 ENCOUNTER — Ambulatory Visit: Payer: 59 | Admitting: Nurse Practitioner

## 2017-07-04 DIAGNOSIS — J069 Acute upper respiratory infection, unspecified: Secondary | ICD-10-CM | POA: Diagnosis not present

## 2017-12-25 ENCOUNTER — Emergency Department (HOSPITAL_COMMUNITY): Payer: 59

## 2017-12-25 ENCOUNTER — Emergency Department (HOSPITAL_COMMUNITY)
Admission: EM | Admit: 2017-12-25 | Discharge: 2017-12-26 | Disposition: A | Discharge status: Home / Self Care | Payer: 59 | Attending: Emergency Medicine | Admitting: Emergency Medicine

## 2017-12-25 ENCOUNTER — Encounter (HOSPITAL_COMMUNITY): Payer: Self-pay | Admitting: Emergency Medicine

## 2017-12-25 DIAGNOSIS — Z91040 Latex allergy status: Secondary | ICD-10-CM | POA: Insufficient documentation

## 2017-12-25 DIAGNOSIS — Z79899 Other long term (current) drug therapy: Secondary | ICD-10-CM | POA: Diagnosis not present

## 2017-12-25 DIAGNOSIS — R1032 Left lower quadrant pain: Secondary | ICD-10-CM | POA: Diagnosis not present

## 2017-12-25 DIAGNOSIS — R102 Pelvic and perineal pain: Secondary | ICD-10-CM | POA: Insufficient documentation

## 2017-12-25 DIAGNOSIS — Z87891 Personal history of nicotine dependence: Secondary | ICD-10-CM | POA: Diagnosis not present

## 2017-12-25 DIAGNOSIS — R109 Unspecified abdominal pain: Secondary | ICD-10-CM | POA: Diagnosis not present

## 2017-12-25 LAB — COMPREHENSIVE METABOLIC PANEL
ALBUMIN: 4 g/dL (ref 3.5–5.0)
ALK PHOS: 64 U/L (ref 38–126)
ALT: 33 U/L (ref 0–44)
ANION GAP: 9 (ref 5–15)
AST: 24 U/L (ref 15–41)
BUN: 10 mg/dL (ref 6–20)
CHLORIDE: 106 mmol/L (ref 98–111)
CO2: 25 mmol/L (ref 22–32)
CREATININE: 0.71 mg/dL (ref 0.44–1.00)
Calcium: 9.3 mg/dL (ref 8.9–10.3)
GFR calc Af Amer: >60 mL/min (ref >60)
GFR calc non Af Amer: >60 mL/min (ref >60)
GLUCOSE: 92 mg/dL (ref 70–99)
Potassium: 4.1 mmol/L (ref 3.5–5.1)
SODIUM: 140 mmol/L (ref 135–145)
Total Bilirubin: 0.5 mg/dL (ref 0.3–1.2)
Total Protein: 7.4 g/dL (ref 6.5–8.1)

## 2017-12-25 LAB — CBC
HCT: 40.1 % (ref 36.0–46.0)
HEMOGLOBIN: 12.7 g/dL (ref 12.0–15.0)
MCH: 28.9 pg (ref 26.0–34.0)
MCHC: 31.7 g/dL (ref 30.0–36.0)
MCV: 91.1 fL (ref 78.0–100.0)
Platelets: 342 10*3/uL (ref 150–400)
RBC: 4.4 MIL/uL (ref 3.87–5.11)
RDW: 13 % (ref 11.5–15.5)
WBC: 13.1 10*3/uL — ABNORMAL HIGH (ref 4.0–10.5)

## 2017-12-25 LAB — I-STAT BETA HCG BLOOD, ED (MC, WL, AP ONLY): I-stat hCG, quantitative: <5 m[IU]/mL (ref <5)

## 2017-12-25 LAB — LIPASE, BLOOD: Lipase: 30 U/L (ref 11–51)

## 2017-12-25 MED ORDER — KETOROLAC TROMETHAMINE 30 MG/ML IJ SOLN
30.0000 mg | Freq: Once | INTRAMUSCULAR | Status: AC
  Administered 2017-12-25: 30 mg via INTRAVENOUS
  Filled 2017-12-25: qty 1

## 2017-12-25 MED ORDER — IOHEXOL 300 MG/ML  SOLN
100.0000 mL | Freq: Once | INTRAMUSCULAR | Status: AC | PRN
  Administered 2017-12-25: 100 mL via INTRAVENOUS

## 2017-12-25 MED ORDER — IOHEXOL 300 MG/ML  SOLN: 100.0000 mL | Freq: Once | INTRAMUSCULAR | Status: DC | PRN

## 2017-12-25 MED ORDER — DICYCLOMINE HCL 20 MG PO TABS: 20.0000 mg | ORAL_TABLET | Freq: Two times a day (BID) | ORAL | 0 refills | Status: DC | PRN

## 2017-12-25 NOTE — Discharge Instructions (Signed)
Your laboratory work-up and CT imaging in the emergency department were reassuring.  We recommend 600 mg ibuprofen every 6 hours for pain.  You may take Bentyl in addition for ongoing pain control.  Follow-up with your primary care doctor.

## 2017-12-25 NOTE — ED Provider Notes (Signed)
[facility] EMERGENCY DEPARTMENT Provider Note   CSN: [id number] Arrival date & time: 12/25/17  1844     History   Chief Complaint Chief Complaint  Patient presents with  . Abdominal Pain    HPI Amanda Davis is a 32 y.o. female.  32 year old female presents to the emergency department for left-sided abdominal pain which radiates around to her epigastrium.  She states the pain is been constant since this morning.  She went to [facility] walk-in clinic and had an x-ray which showed "air-fluid levels".  Patient also had a leukocytosis at this visit.  She was advised to come to the ED for rule out diverticulitis versus bowel obstruction.  She did have a bowel movement yesterday and it is not abnormal for her to go 1 to 2 days between bowel movements.  Abdominal surgical history significant for appendectomy and bilateral salpingectomy.   Abdominal Pain   This is a new problem. The current episode started 6 to 12 hours ago. The problem occurs constantly. The problem has not changed since onset.The pain is located in the LLQ, LUQ and epigastric region. The pain is moderate. Pertinent negatives include fever, diarrhea, hematochezia, melena, nausea, vomiting, dysuria, frequency and hematuria. The symptoms are aggravated by palpation. Nothing relieves the symptoms.    Past Medical History:  Diagnosis Date  . Abnormal Pap smear of cervix ~2005   colpo biopsy  . Anxiety   . Depression   . Hemorrhage after delivery of fetus 2011  . SVD (spontaneous vaginal delivery)    x 2    Patient Active Problem List   Diagnosis Date Noted  . Weight gain 12/21/2015  . Irregular menses 12/21/2015    Past Surgical History:  Procedure Laterality Date  . APPENDECTOMY  2003  . BREAST SURGERY Bilateral 11/14/2002   Breast Reduction   . COLPOSCOPY  ~2005   Normal - per pt  . KNEE SURGERY     left  . LAPAROSCOPIC BILATERAL SALPINGECTOMY N/A 02/28/2016   Procedure: LAPAROSCOPIC  BILATERAL SALPINGECTOMY;  Surgeon: Romualdo Bolk, MD;  Location: [facility];  Service: Gynecology;  Laterality: N/A;  Beth RNFA  . TUBAL LIGATION    . WISDOM TOOTH EXTRACTION       OB History    Gravida  2   Para  2   Term  2   Preterm      AB      Living  2     SAB      TAB      Ectopic      Multiple      Live Births  2            Home Medications    Prior to Admission medications   Medication Sig Start Date End Date Taking? Authorizing Provider  aspirin-acetaminophen-caffeine (EXCEDRIN MIGRAINE) [phone] MG tablet Take 2 tablets by mouth every 6 (six) hours as needed for headache.   Yes [provider]  Multiple Vitamin (MULTIVITAMIN) tablet Take 1 tablet by mouth at bedtime.    Yes [provider]  dicyclomine (BENTYL) 20 MG tablet Take 1 tablet (20 mg total) by mouth every 12 (twelve) hours as needed (for abdominal pain/cramping). 12/25/17   Antony Madura, PA-C    Family History Family History  Adopted: Yes    Social History Social History   Tobacco Use  . Smoking status: Former Smoker    Packs/day: 0.25    Years: 7.00    Pack  years: 1.75    Types: Cigarettes    Last attempt to quit: 07/26/2014    Years since quitting: 3.4  . Smokeless tobacco: Never Used  Substance Use Topics  . Alcohol use: Yes    Alcohol/week: 0.6 - 1.2 oz    Types: 1 - 2 Glasses of wine per week  . Drug use: No     Allergies   Latex   Review of Systems Review of Systems  Constitutional: Negative for fever.  Gastrointestinal: Positive for abdominal pain. Negative for diarrhea, hematochezia, melena, nausea and vomiting.  Genitourinary: Negative for dysuria, frequency and hematuria.  Ten systems reviewed and are negative for acute change, except as noted in the HPI.    Physical Exam Updated Vital Signs BP 108/70   Pulse 68   Temp 97.9 F (36.6 C) (Oral)   Resp 18   Ht 5\' 9"  (1.753 m)   Wt (!) 138.8 kg (306 lb)   LMP 12/07/2017   SpO2  99%   BMI 45.19 kg/m   Physical Exam  Constitutional: She is oriented to person, place, and time. She appears well-developed and well-nourished. No distress.  Nontoxic appearing, pleasant.  HENT:  Head: Normocephalic and atraumatic.  Eyes: Conjunctivae and EOM are normal. No scleral icterus.  Neck: Normal range of motion.  Cardiovascular: Normal rate, regular rhythm and intact distal pulses.  Pulmonary/Chest: Effort normal. No stridor. No respiratory distress.  Respirations even and unlabored  Abdominal: Soft. She exhibits no mass. There is tenderness (LUQ and LLQ). There is guarding (voluntary). There is no rebound.  Soft, obese abdomen.  Musculoskeletal: Normal range of motion.  Neurological: She is alert and oriented to person, place, and time. She exhibits normal muscle tone. Coordination normal.  GCS 15. Moving all extremities spontaneously. Ambulatory with steady gait.  Skin: Skin is warm and dry. No rash noted. She is not diaphoretic. No erythema. No pallor.  Psychiatric: She has a normal mood and affect. Her behavior is normal.  Nursing note and vitals reviewed.    ED Treatments / Results  Labs (all labs ordered are listed, but only abnormal results are displayed) Labs Reviewed  CBC - Abnormal; Notable for the following components:      Result Value   WBC 13.1 (*)    All other components within normal limits  LIPASE, BLOOD  COMPREHENSIVE METABOLIC PANEL  URINALYSIS, ROUTINE W REFLEX MICROSCOPIC  I-STAT BETA HCG BLOOD, ED ([facility], [facility], [facility] ONLY)    EKG None  Radiology Ct Abdomen Pelvis W Contrast  Result Date: 12/25/2017 CLINICAL DATA:  Acute abdominal pain EXAM: CT ABDOMEN AND PELVIS WITH CONTRAST TECHNIQUE: Multidetector CT imaging of the abdomen and pelvis was performed using the standard protocol following bolus administration of intravenous contrast. CONTRAST:  100mL OMNIPAQUE IOHEXOL 300 MG/ML  SOLN COMPARISON:  Chest CT 02/12/2007 Abdominal ultrasound  02/16/2014  FINDINGS: LOWER CHEST: No basilar pulmonary nodules or pleural effusion. No apical pericardial effusion. HEPATOBILIARY: Normal hepatic contours and density. No intra- or extrahepatic biliary dilatation. There is a 16 mm stone at the gallbladder neck. PANCREAS: Normal parenchymal contours without ductal dilatation. No peripancreatic fluid collection. SPLEEN: Normal. ADRENALS/URINARY TRACT: --Adrenal glands: Normal. --Right kidney/ureter: No hydronephrosis, nephroureterolithiasis, perinephric stranding or solid renal mass. --Left kidney/ureter: No hydronephrosis, nephroureterolithiasis, perinephric stranding or solid renal mass. --Urinary bladder: Normal for degree of distention STOMACH/BOWEL: --Stomach/Duodenum: No hiatal hernia or other gastric abnormality. Normal duodenal course. --Small bowel: No dilatation or inflammation. --Colon: No focal abnormality. --Appendix: Surgically absent. VASCULAR/LYMPHATIC: Normal  course and caliber of the major abdominal vessels. No abdominal or pelvic lymphadenopathy. REPRODUCTIVE: Normal uterus and ovaries. MUSCULOSKELETAL. 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 OTHER: None. IMPRESSION: 1. 16 mm stone at the gallbladder neck. If there is right upper quadrant pain, consider further evaluation with dedicated ultrasound. 2. Otherwise normal abdominal and pelvic organs. 3. 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 Electronically Signed   By: Deatra Robinson M.D.   On: 12/25/2017 22:59    Procedures Procedures (including critical care time)  Medications Ordered in ED Medications  iohexol (OMNIPAQUE) 300 MG/ML solution 100 mL (has no administration in time range)  iohexol (OMNIPAQUE) 300 MG/ML solution 100 mL (100 mLs Intravenous Contrast Given 12/25/17 2234)  ketorolac (TORADOL) 30 MG/ML injection 30 mg (30 mg Intravenous Given 12/25/17 2353)     Initial Impression / Assessment and Plan  / ED Course  I have reviewed the triage vital signs and the nursing notes.  Pertinent labs & imaging results that were available during my care of the patient were reviewed by me and considered in my medical decision making (see chart for details).     32 year old female presents to the emergency department after presenting to ER walk-in clinic for complaints of abdominal pain.  Symptoms began this morning.  She has had no vomiting or bowel changes.  Denies urinary symptoms.  The patient is afebrile and hemodynamically stable in the ED.  She has a nonspecific leukocytosis.  CT was performed given concern for diverticulitis versus bowel obstruction on outpatient x-ray.  CT scan today shows 16mm gallstone at the neck of the gallbladder.  This is chronic for the patient and she has no right upper quadrant or epigastric tenderness on initial or repeat exam.  No current concern for cholecystitis.  There is no other acute process noted in the abdomen or pelvis.  Suspect viral etiology versus constipation.  Will continue with outpatient supportive management.  Patient prescribed Bentyl to use in conjunction with ibuprofen.  Encourage primary care follow-up with return if symptoms worsen.  Return precautions discussed and provided. Patient discharged in stable condition with no unaddressed concerns.   Final Clinical Impressions(s) / ED Diagnoses   Final diagnoses:  Left lower quadrant pain    ED Discharge Orders        Ordered    dicyclomine (BENTYL) 20 MG tablet  Every 12 hours PRN     12/25/17 2356       Antony Madura, PA-C 12/26/17 0024    Charlynne Pander, MD 12/26/17 [phone]

## 2017-12-25 NOTE — ED Triage Notes (Signed)
Pt c/o L sided abd pain onset this am, pt seen at [facility] today, sent to ED to R/O SBO vs Diverticulitis. Per notes Xray showing "air fluid levels, WBC 12.9.  Denies n/v/d, normal BM yesterday

## 2017-12-25 NOTE — ED Notes (Signed)
Patient transported to CT 

## 2017-12-28 DIAGNOSIS — R1084 Generalized abdominal pain: Secondary | ICD-10-CM | POA: Diagnosis not present

## 2017-12-29 NOTE — Progress Notes (Signed)
32 y.o. [id number] Legally SeparatedCaucasianF here for annual exam.   She c/o LLQ abdominal/pelvic pain intermittently for 5 days. The pain is a sharp stabbing pain, worse with movement, up to a 7/10 in pain. Doesn't hurt if she is still. Heat helps.  No bowel or bladder c/o. Period Cycle (Days): 28 Period Duration (Days): 5 days Period Pattern: Regular Menstrual Flow: Moderate Menstrual Control: Tampon Menstrual Control Change Freq (Hours): changes tampon 3-4 hours Dysmenorrhea: (!) Mild Dysmenorrhea Symptoms: Cramping Patient's last menstrual period was 12/07/2017 (exact date).   Sexually active, same partner x 1 year. No dyspareunia.          Sexually active: Yes.    The current method of family planning is tubal ligation (s/p laparoscopic salpingectomies in 9/17).     Exercising: No.  The patient does not participate in regular exercise at present. Smoker:  no  Health Maintenance: Pap:  12/15/14 Neg. HR HPV:neg  History of abnormal Pap:  Yes, CIN I 2005 MMG:  never TDaP:  2011 Gardasil: Patient is unsure, will check records.    reports that she quit smoking about 3 years ago. Her smoking use included cigarettes. She has a 1.75 pack-year smoking history. She has never used smokeless tobacco. She reports that she drinks about 0.6 - 1.2 oz of alcohol per week. She reports that she does not use drugs. She works at [employer]. Kids are 5(girl) and 8 (boy). She has full custody of her kids, Ex seems them at most every other weekend.    Past Medical History:  Diagnosis Date  . Abnormal Pap smear of cervix ~2005   colpo biopsy  . Anxiety   . Depression   . Hemorrhage after delivery of fetus 2011  . SVD (spontaneous vaginal delivery)    x 2    Past Surgical History:  Procedure Laterality Date  . APPENDECTOMY  2003  . BREAST SURGERY Bilateral 11/14/2002   Breast Reduction   . COLPOSCOPY  ~2005   Normal - per pt  . KNEE SURGERY     left  . LAPAROSCOPIC BILATERAL  SALPINGECTOMY N/A 02/28/2016   Procedure: LAPAROSCOPIC BILATERAL SALPINGECTOMY;  Surgeon: Romualdo Bolk, MD;  Location: [facility];  Service: Gynecology;  Laterality: N/A;  Beth RNFA  . TUBAL LIGATION    . WISDOM TOOTH EXTRACTION      Current Outpatient Medications  Medication Sig Dispense Refill  . aspirin-acetaminophen-caffeine (EXCEDRIN MIGRAINE) 250-250-65 MG tablet Take 2 tablets by mouth every 6 (six) hours as needed for headache.    . Multiple Vitamin (MULTIVITAMIN) tablet Take 1 tablet by mouth at bedtime.     [name] omeprazole (PRILOSEC) 40 MG capsule Take 40 mg by mouth daily.     No current facility-administered medications for this visit.     Family History  Adopted: Yes    Review of Systems  Constitutional: Negative.   HENT: Negative.   Eyes: Negative.   Respiratory: Negative.   Cardiovascular: Negative.   Gastrointestinal: Positive for abdominal pain.  Endocrine: Negative.   Genitourinary: Negative.   Musculoskeletal: Negative.   Skin: Negative.   Allergic/Immunologic: Negative.   Neurological: Negative.   Hematological: Negative.   Psychiatric/Behavioral: Negative.     Exam:   BP 120/80 (BP Location: Right Arm, Patient Position: Sitting)   Pulse 88   Ht 5' 7.72" (1.72 m)   Wt (!) 304 lb 6.4 oz (138.1 kg)   LMP 12/07/2017 (Exact Date)   BMI 46.67 kg/m   Weight change: @  WEIGHTCHANGE@ Height:   Height: 5' 7.72" (172 cm)  Ht Readings from Last 3 Encounters:  12/30/17 5' 7.72" (1.72 m)  12/25/17 5\' 9"  (1.753 m)  12/25/16 5' 8.25" (1.734 m)    General appearance: alert, cooperative and appears stated age Head: Normocephalic, without obvious abnormality, atraumatic Neck: no adenopathy, supple, symmetrical, trachea midline and thyroid normal to inspection and palpation Lungs: clear to auscultation bilaterally Cardiovascular: regular rate and rhythm Breasts: normal appearance, no masses or tenderness Abdomen: soft, mildly tender in the LLQ, no rebound, no  guarding. Slightly less tender with tensing her abdominal wall; non distended,  no masses,  no organomegaly Extremities: extremities normal, atraumatic, no cyanosis or edema Skin: Skin color, texture, turgor normal. No rashes or lesions Lymph nodes: Cervical, supraclavicular, and axillary nodes normal. No abnormal inguinal nodes palpated Neurologic: Grossly normal   Pelvic: External genitalia:  no lesions              Urethra:  normal appearing urethra with no masses, tenderness or lesions              Bartholins and Skenes: normal                 Vagina: normal appearing vagina with normal color and discharge, no lesions              Cervix: no lesions               Bimanual Exam:  Uterus:  no masses or tenderness, limited by BMI              Adnexa: no mass, fullness, tenderness               Rectovaginal: Confirms               Anus:  normal sphincter tone, no lesions  Chaperone was present for exam.  A:  Well Woman with normal exam  Abdominal pelvic pain, negative CT on 7/19. Mildly tender on abdominal exam  BMI 46  P:   Pap with hpv  TL for contraception  Lipid, HgbA1C, TSH (she is fasting), CBC with diff (elevated last week). Recent normal CMP  Discussed breast self exam  Discussed calcium and vit D intake  Will refer to weight loss clinic  If pain persists f/u with primary, could consider ultrasound, but given negative CT low yield

## 2017-12-30 ENCOUNTER — Encounter: Payer: Self-pay | Admitting: Obstetrics and Gynecology

## 2017-12-30 ENCOUNTER — Ambulatory Visit (INDEPENDENT_AMBULATORY_CARE_PROVIDER_SITE_OTHER): Payer: 59 | Admitting: Obstetrics and Gynecology

## 2017-12-30 ENCOUNTER — Other Ambulatory Visit (HOSPITAL_COMMUNITY)
Admission: RE | Admit: 2017-12-30 | Discharge: 2017-12-30 | Disposition: A | Discharge status: Home / Self Care | Payer: 59 | Source: Ambulatory Visit | Attending: Obstetrics and Gynecology | Admitting: Obstetrics and Gynecology

## 2017-12-30 ENCOUNTER — Other Ambulatory Visit: Payer: Self-pay

## 2017-12-30 VITALS — BP 120/80 | HR 88 | Ht 67.72 in | Wt 304.4 lb

## 2017-12-30 DIAGNOSIS — Z01419 Encounter for gynecological examination (general) (routine) without abnormal findings: Secondary | ICD-10-CM

## 2017-12-30 DIAGNOSIS — Z124 Encounter for screening for malignant neoplasm of cervix: Secondary | ICD-10-CM

## 2017-12-30 DIAGNOSIS — R109 Unspecified abdominal pain: Secondary | ICD-10-CM

## 2017-12-30 DIAGNOSIS — Z0000 Encounter for general adult medical examination without abnormal findings: Secondary | ICD-10-CM | POA: Insufficient documentation

## 2017-12-30 DIAGNOSIS — Z6841 Body Mass Index (BMI) 40.0 and over, adult: Secondary | ICD-10-CM | POA: Diagnosis not present

## 2017-12-30 DIAGNOSIS — R102 Pelvic and perineal pain: Secondary | ICD-10-CM | POA: Diagnosis not present

## 2017-12-30 NOTE — Patient Instructions (Signed)

## 2017-12-31 LAB — CBC WITH DIFFERENTIAL/PLATELET
Basophils Absolute: 0 10*3/uL (ref 0.0–0.2)
Basos: 0 %
EOS (ABSOLUTE): 0.6 10*3/uL — AB (ref 0.0–0.4)
Eos: 5 %
HEMATOCRIT: 39.9 % (ref 34.0–46.6)
Hemoglobin: 13 g/dL (ref 11.1–15.9)
IMMATURE GRANULOCYTES: 0 %
Immature Grans (Abs): 0 10*3/uL (ref 0.0–0.1)
LYMPHS ABS: 3.3 10*3/uL — AB (ref 0.7–3.1)
LYMPHS: 25 %
MCH: 29.3 pg (ref 26.6–33.0)
MCHC: 32.6 g/dL (ref 31.5–35.7)
MCV: 90 fL (ref 79–97)
MONOS ABS: 0.7 10*3/uL (ref 0.1–0.9)
Monocytes: 5 %
NEUTROS PCT: 65 %
Neutrophils Absolute: 8.5 10*3/uL — ABNORMAL HIGH (ref 1.4–7.0)
PLATELETS: 370 10*3/uL (ref 150–450)
RBC: 4.44 x10E6/uL (ref 3.77–5.28)
RDW: 13.9 % (ref 12.3–15.4)
WBC: 13.1 10*3/uL — ABNORMAL HIGH (ref 3.4–10.8)

## 2017-12-31 LAB — LIPID PANEL
CHOLESTEROL TOTAL: 189 mg/dL (ref 100–199)
Chol/HDL Ratio: 4.2 ratio (ref 0.0–4.4)
HDL: 45 mg/dL (ref >39)
LDL CALC: 124 mg/dL — AB (ref 0–99)
Triglycerides: 102 mg/dL (ref 0–149)
VLDL Cholesterol Cal: 20 mg/dL (ref 5–40)

## 2017-12-31 LAB — TSH: TSH: 2.37 u[IU]/mL (ref 0.450–4.500)

## 2017-12-31 LAB — HEMOGLOBIN A1C
ESTIMATED AVERAGE GLUCOSE: 108 mg/dL
Hgb A1c MFr Bld: 5.4 % (ref 4.8–5.6)

## 2018-01-01 LAB — CYTOLOGY - PAP
Chlamydia: NEGATIVE
DIAGNOSIS: NEGATIVE
HPV (WINDOPATH): NOT DETECTED
Neisseria Gonorrhea: NEGATIVE

## 2018-01-18 DIAGNOSIS — H5213 Myopia, bilateral: Secondary | ICD-10-CM | POA: Diagnosis not present

## 2018-02-09 DIAGNOSIS — Z0000 Encounter for general adult medical examination without abnormal findings: Secondary | ICD-10-CM | POA: Diagnosis not present

## 2018-07-12 DIAGNOSIS — K649 Unspecified hemorrhoids: Secondary | ICD-10-CM | POA: Diagnosis not present

## 2018-09-03 ENCOUNTER — Telehealth: Payer: Self-pay | Admitting: Obstetrics and Gynecology

## 2018-09-03 NOTE — Telephone Encounter (Signed)
Patient is calling regarding a pimple that she has under her breast. Patient stated there is a pea size lump under the pimple. Patient stated that she is having to work from home and if she does not answer at [phone], to please try [phone].

## 2018-09-03 NOTE — Telephone Encounter (Signed)
Spoke with patient. Report "pimple" with white head on left breast with hard pea size lump underneath. Painful to touch. Denies redness, fever/chills, nipple d/c. Noticed this morning. Recommended OV for further evaluation, OV scheduled for 3/30 at 11am with Dr. Edward Jolly. Instructed patient not to press on lump, recommended warm compresses. Patient verbalizes understanding.   Routing to provider for final review. Patient is agreeable to disposition. Will close encounter.  Cc: Dr. Oscar La

## 2018-09-06 ENCOUNTER — Encounter: Payer: Self-pay | Admitting: Obstetrics and Gynecology

## 2018-09-06 ENCOUNTER — Ambulatory Visit: Payer: 59 | Admitting: Obstetrics and Gynecology

## 2018-09-06 ENCOUNTER — Other Ambulatory Visit: Payer: Self-pay

## 2018-09-06 VITALS — BP 120/80 | HR 72 | Resp 12 | Ht 67.72 in | Wt 312.0 lb

## 2018-09-06 DIAGNOSIS — N6320 Unspecified lump in the left breast, unspecified quadrant: Secondary | ICD-10-CM

## 2018-09-06 NOTE — Progress Notes (Signed)
GYNECOLOGY  VISIT   HPI: 33 y.o.   Legally Separated  Caucasian  female   G2P2002 with Patient's last menstrual period was 08/27/2018.   here for left breast lump since Friday 09/03/18   States a pea sized lump under her breast.  Mother visualized the area for her and noted a pimple and an lump.  Pimple is now gone.  Yesterday she started having pain in her left breast at the site of the lump.  No fever or flu like symptoms.   No hx of breast infection.   States she has not had a mammogram.  Uncertain if she had a breast US.   Had bilateral breast reduction in 2003.   Patient is adopted and she believes there is not hx of breast cancer.   Not taking any form of hormonal treatment.   GYNECOLOGIC HISTORY: Patient's last menstrual period was 08/27/2018. Contraception:  Tubal ligation  Menopausal hormone therapy:  n/a Last mammogram:  n/a Last pap smear:   12/30/17 Neg:Neg HR HPV        OB History    Gravida  2   Para  2   Term  2   Preterm  0   AB  0   Living  2     SAB  0   TAB  0   Ectopic  0   Multiple  0   Live Births  2              Patient Active Problem List   Diagnosis Date Noted  . Weight gain 12/21/2015  . Irregular menses 12/21/2015    Past Medical History:  Diagnosis Date  . Abnormal Pap smear of cervix ~2005   colpo biopsy  . Anxiety   . Depression   . Hemorrhage after delivery of fetus 2011  . SVD (spontaneous vaginal delivery)    x 2    Past Surgical History:  Procedure Laterality Date  . APPENDECTOMY  2003  . BREAST SURGERY Bilateral 11/14/2002   Breast Reduction   . COLPOSCOPY  ~2005   Normal - per pt  . KNEE SURGERY     left  . LAPAROSCOPIC BILATERAL SALPINGECTOMY N/A 02/28/2016   Procedure: LAPAROSCOPIC BILATERAL SALPINGECTOMY;  Surgeon: Romualdo Bolk, MD;  Location: [facility];  Service: Gynecology;  Laterality: N/A;  Beth RNFA  . TUBAL LIGATION    . WISDOM TOOTH EXTRACTION      Current Outpatient Medications   Medication Sig Dispense Refill  . aspirin-acetaminophen-caffeine (EXCEDRIN MIGRAINE) 250-250-65 MG tablet Take 2 tablets by mouth every 6 (six) hours as needed for headache.    . Multiple Vitamin (MULTIVITAMIN) tablet Take 1 tablet by mouth at bedtime.      No current facility-administered medications for this visit.      ALLERGIES: Latex  Family History  Adopted: Yes    Social History   Socioeconomic History  . Marital status: Legally Separated    Spouse name: Not on file  . Number of children: Not on file  . Years of education: Not on file  . Highest education level: Not on file  Occupational History  . Not on file  Social Needs  . Financial resource strain: Not on file  . Food insecurity:    Worry: Not on file    Inability: Not on file  . Transportation needs:    Medical: Not on file    Non-medical: Not on file  Tobacco Use  . Smoking status: Former Smoker  Packs/day: 0.25    Years: 7.00    Pack years: 1.75    Types: Cigarettes    Last attempt to quit: 07/26/2014    Years since quitting: 4.1  . Smokeless tobacco: Never Used  Substance and Sexual Activity  . Alcohol use: Yes    Alcohol/week: 1.0 - 2.0 standard drinks    Types: 1 - 2 Glasses of wine per week  . Drug use: No  . Sexual activity: Yes    Birth control/protection: Surgical    Comment: tubal ligation  Lifestyle  . Physical activity:    Days per week: Not on file    Minutes per session: Not on file  . Stress: Not on file  Relationships  . Social connections:    Talks on phone: Not on file    Gets together: Not on file    Attends religious service: Not on file    Active member of club or organization: Not on file    Attends meetings of clubs or organizations: Not on file    Relationship status: Not on file  . Intimate partner violence:    Fear of current or ex partner: Not on file    Emotionally abused: Not on file    Physically abused: Not on file    Forced sexual activity: Not on file   Other Topics Concern  . Not on file  Social History Narrative  . Not on file    Review of Systems  Constitutional:       Left breast lump  HENT: Negative.   Eyes: Negative.   Respiratory: Negative.   Cardiovascular: Negative.   Gastrointestinal: Negative.   Endocrine: Negative.   Genitourinary: Negative.   Musculoskeletal: Negative.   Skin: Negative.   Allergic/Immunologic: Negative.   Neurological: Negative.   Hematological: Negative.   Psychiatric/Behavioral: Negative.     PHYSICAL EXAMINATION:    BP 120/80 (BP Location: Right Arm, Patient Position: Sitting, Cuff Size: Large)   Pulse 72   Resp 12   Ht 5' 7.72" (1.72 m)   Wt (!) 312 lb (141.5 kg)   LMP 08/27/2018   BMI 47.83 kg/m     General appearance: alert, cooperative and appears stated age   Breasts: bilateral reduction scars, Right breast -  no masses or tenderness, No nipple retraction or dimpling, No nipple discharge or bleeding, No axillary adenopathy. Left breast - 7 mm mass at 8:00, periphery of breast, superficial (possible sebaceous cyst), Slight nipple retraction (old change per patient), No nipple discharge or bleeding, No axillary adenopathy.  Chaperone was present for exam.  ASSESSMENT  Left breast mass.  Probable sebaceous cyst.  Status post bilateral breast reduction.   PLAN  We discussed sebaceous cysts.  Will order dx mammogram and left breast US.   An After Visit Summary was printed and given to the patient.  __15____ minutes face to face time of which over 50% was spent in counseling.

## 2018-09-06 NOTE — Patient Instructions (Signed)
Epidermal Cyst    An epidermal cyst is a sac made of skin tissue. The sac contains a substance called keratin. Keratin is a protein that is normally secreted through the hair follicles. When keratin becomes trapped in the top layer of skin (epidermis), it can form an epidermal cyst.  Epidermal cysts can be found anywhere on your body. These cysts are usually harmless (benign), and they may not cause symptoms unless they become infected.  What are the causes?  This condition may be caused by:   A blocked hair follicle.   A hair that curls and re-enters the skin instead of growing straight out of the skin (ingrown hair).   A blocked pore.   Irritated skin.   An injury to the skin.   Certain conditions that are passed along from parent to child (inherited).   Human papillomavirus (HPV).   Long-term (chronic) sun damage to the skin.  What increases the risk?  The following factors may make you more likely to develop an epidermal cyst:   Having acne.   Being overweight.   Being 30-40 years old.  What are the signs or symptoms?  The only symptom of this condition may be a small, painless lump underneath the skin. When an epidermal cyst ruptures, it may become infected. Symptoms may include:   Redness.   Inflammation.   Tenderness.   Warmth.   Fever.   Keratin draining from the cyst. Keratin is grayish-white, bad-smelling substance.   Pus draining from the cyst.  How is this diagnosed?  This condition is diagnosed with a physical exam.   In some cases, you may have a sample of tissue (biopsy) taken from your cyst to be examined under a microscope or tested for bacteria.   You may be referred to a health care provider who specializes in skin care (dermatologist).  How is this treated?  In many cases, epidermal cysts go away on their own without treatment. If a cyst becomes infected, treatment may include:   Opening and draining the cyst, done by a health care provider. After draining, minor surgery to  remove the rest of the cyst may be done.   Antibiotic medicine.   Injections of medicines (steroids) that help to reduce inflammation.   Surgery to remove the cyst. Surgery may be done if the cyst:  ? Becomes large.  ? Bothers you.  ? Has a chance of turning into cancer.   Do not try to open a cyst yourself.  Follow these instructions at home:   Take over-the-counter and prescription medicines only as told by your health care provider.   If you were prescribed an antibiotic medicine, take it it as told by your health care provider. Do not stop using the antibiotic even if you start to feel better.   Keep the area around your cyst clean and dry.   Wear loose, dry clothing.   Avoid touching your cyst.   Check your cyst every day for signs of infection. Check for:  ? Redness, swelling, or pain.  ? Fluid or blood.  ? Warmth.  ? Pus or a bad smell.   Keep all follow-up visits as told by your health care provider. This is important.  How is this prevented?   Wear clean, dry, clothing.   Avoid wearing tight clothing.   Keep your skin clean and dry. Take showers or baths every day.  Contact a health care provider if:   Your cyst develops symptoms of infection.     Your condition is not improving or is getting worse.   You develop a cyst that looks different from other cysts you have had.   You have a fever.  Get help right away if:   Redness spreads from the cyst into the surrounding area.  Summary   An epidermal cyst is a sac made of skin tissue. These cysts are usually harmless (benign), and they may not cause symptoms unless they become infected.   If a cyst becomes infected, treatment may include surgery to open and drain the cyst, or to remove it. Treatment may also include medicines by mouth or through an injection.   Take over-the-counter and prescription medicines only as told by your health care provider. If you were prescribed an antibiotic medicine, take it as told by your health care  provider. Do not stop using the antibiotic even if you start to feel better.   Contact a health care provider if your condition is not improving or is getting worse.   Keep all follow-up visits as told by your health care provider. This is important.  This information is not intended to replace advice given to you by your health care provider. Make sure you discuss any questions you have with your health care provider.  Document Released: 04/26/2004 Document Revised: 12/07/2017 Document Reviewed: 12/07/2017  Elsevier Interactive Patient Education  2019 [employer]

## 2018-09-06 NOTE — Progress Notes (Signed)
Patient scheduled while in office for left breast Dx MMG and [name], if needed, at [facility] on 09/07/18 at 8am. Copy of order given to patient, copy of order faxed to [facility]. Patient verbalizes understanding and is agreeable.

## 2018-09-07 ENCOUNTER — Encounter: Payer: Self-pay | Admitting: Obstetrics and Gynecology

## 2018-11-11 ENCOUNTER — Encounter: Payer: Self-pay | Admitting: Obstetrics and Gynecology

## 2018-12-30 NOTE — Progress Notes (Signed)
33 y.o. [id number] Legally Separated White or Caucasian Not Hispanic or Latino female here for annual exam.  Sexually active, same partner x 2-3 years. No dyspareunia.  Period Cycle (Days): 28 Period Duration (Days): 4 days Period Pattern: Regular Menstrual Flow: Moderate, Light Menstrual Control: Tampon, Panty liner Menstrual Control Change Freq (Hours): changes tampon every 4-6 hours Dysmenorrhea: None   She has been working on weight loss. She has lost 12 lbs since March, she is watching what she eating and walking.   Patient's last menstrual period was 01/01/2019 (exact date).          Sexually active: Yes.    The current method of family planning is tubal ligation (s/p laparoscopic salpingectomies in 9/17).   Exercising: Yes.    walking Smoker:  no  Health Maintenance: Pap:12/30/2017 WNL NEG HPV, 12/15/14 Neg. HR HPV:neg History of abnormal Pap:Yes, CIN I 2005 MMG:08/28/2018 Bilateral diagnostic with left breast ultrasound Birads 2 benign TDaP:06/09/2009 Gardasil: Patient thinks she has completed    reports that she quit smoking about 4 years ago. Her smoking use included cigarettes. She has a 1.75 pack-year smoking history. She has never used smokeless tobacco. She reports current alcohol use of about 1.0 - 2.0 standard drinks of alcohol per week. She reports that she does not use drugs. She works at [employer]. Kids are 6(girl) and 549 (boy).She has full custody of her kids, Ex seems them at most every other weekend.    Past Medical History:  Diagnosis Date  . Abnormal Pap smear of cervix ~2005   colpo biopsy  . Anxiety   . Depression   . Hemorrhage after delivery of fetus 2011  . SVD (spontaneous vaginal delivery)    x 2    Past Surgical History:  Procedure Laterality Date  . APPENDECTOMY  2003  . BREAST SURGERY Bilateral 11/14/2002   Breast Reduction   . COLPOSCOPY  ~2005   Normal - per pt  . KNEE SURGERY     left  . LAPAROSCOPIC BILATERAL SALPINGECTOMY  N/A 02/28/2016   Procedure: LAPAROSCOPIC BILATERAL SALPINGECTOMY;  Surgeon: Romualdo BolkJill Evelyn Imani Sherrin, MD;  Location: [facility];  Service: Gynecology;  Laterality: N/A;  Beth RNFA  . TUBAL LIGATION    . WISDOM TOOTH EXTRACTION      Current Outpatient Medications  Medication Sig Dispense Refill  . aspirin-acetaminophen-caffeine (EXCEDRIN MIGRAINE) 250-250-65 MG tablet Take 2 tablets by mouth every 6 (six) hours as needed for headache.    . Multiple Vitamin (MULTIVITAMIN) tablet Take 1 tablet by mouth at bedtime.      No current facility-administered medications for this visit.     Family History  Adopted: Yes    Review of Systems  Constitutional: Negative.   HENT: Negative.   Eyes: Negative.   Respiratory: Negative.   Cardiovascular: Negative.   Gastrointestinal: Negative.   Endocrine: Negative.   Genitourinary: Negative.   Musculoskeletal: Negative.   Skin: Negative.   Allergic/Immunologic: Negative.   Neurological: Negative.   Hematological: Negative.   Psychiatric/Behavioral: Negative.     Exam:   BP 122/80 (BP Location: Right Arm, Patient Position: Sitting, Cuff Size: Large)   Pulse 72   Temp 98.2 F (36.8 C) (Skin)   Ht 5' 7.72" (1.72 m)   Wt (!) 301 lb (136.5 kg)   LMP 01/01/2019 (Exact Date)   BMI 46.15 kg/m   Weight change: @WEIGHTCHANGE @ Height:   Height: 5' 7.72" (172 cm)  Ht Readings from Last 3 Encounters:  01/05/19 5' 7.72" (  1.72 m)  09/06/18 5' 7.72" (1.72 m)  12/30/17 5' 7.72" (1.72 m)    General appearance: alert, cooperative and appears stated age Head: Normocephalic, without obvious abnormality, atraumatic Neck: no adenopathy, supple, symmetrical, trachea midline and thyroid normal to inspection and palpation Lungs: clear to auscultation bilaterally Cardiovascular: regular rate and rhythm Breasts: normal appearance, no masses or tenderness Abdomen: soft, non-tender; non distended,  no masses,  no organomegaly Extremities: extremities normal,  atraumatic, no cyanosis or edema Skin: Skin color, texture, turgor normal. No rashes or lesions Lymph nodes: Cervical, supraclavicular, and axillary nodes normal. No abnormal inguinal nodes palpated Neurologic: Grossly normal   Pelvic: External genitalia:  no lesions              Urethra:  normal appearing urethra with no masses, tenderness or lesions              Bartholins and Skenes: normal                 Vagina: normal appearing vagina with normal color and discharge, no lesions              Cervix: no lesions               Bimanual Exam:  Uterus:  no masses or tenderness, exam limited by BMI              Adnexa: no mass, fullness, tenderness               Rectovaginal: Confirms               Anus:  normal sphincter tone, no lesions  Chaperone was present for exam.  A:  Well Woman with normal exam  Elevated BMI  P:   No pap this year  Screening labs, HgbA1C, TSH (not fasting, she understands she may need to return for fasting lipid panel)  TDAP  Discussed breast self exam  Discussed calcium and vit D intake

## 2019-01-05 ENCOUNTER — Encounter: Payer: Self-pay | Admitting: Obstetrics and Gynecology

## 2019-01-05 ENCOUNTER — Ambulatory Visit (INDEPENDENT_AMBULATORY_CARE_PROVIDER_SITE_OTHER): Payer: 59 | Admitting: Obstetrics and Gynecology

## 2019-01-05 ENCOUNTER — Other Ambulatory Visit: Payer: Self-pay

## 2019-01-05 VITALS — BP 122/80 | HR 72 | Temp 98.2°F | Ht 67.72 in | Wt 301.0 lb

## 2019-01-05 DIAGNOSIS — Z23 Encounter for immunization: Secondary | ICD-10-CM | POA: Diagnosis not present

## 2019-01-05 DIAGNOSIS — Z6841 Body Mass Index (BMI) 40.0 and over, adult: Secondary | ICD-10-CM

## 2019-01-05 DIAGNOSIS — Z01419 Encounter for gynecological examination (general) (routine) without abnormal findings: Secondary | ICD-10-CM

## 2019-01-05 DIAGNOSIS — Z0000 Encounter for general adult medical examination without abnormal findings: Secondary | ICD-10-CM

## 2019-01-05 NOTE — Patient Instructions (Signed)
EXERCISE AND DIET:  We recommended that you start or continue a regular exercise program for good health. Regular exercise means any activity that makes your heart beat faster and makes you sweat.  We recommend exercising at least 30 minutes per day at least 3 days a week, preferably 4 or 5.  We also recommend a diet low in fat and sugar.  Inactivity, poor dietary choices and obesity can cause diabetes, heart attack, stroke, and kidney damage, among others.    ALCOHOL AND SMOKING:  Women should limit their alcohol intake to no more than 7 drinks/beers/glasses of wine (combined, not each!) per week. Moderation of alcohol intake to this level decreases your risk of breast cancer and liver damage. And of course, no recreational drugs are part of a healthy lifestyle.  And absolutely no smoking or even second hand smoke. Most people know smoking can cause heart and lung diseases, but did you know it also contributes to weakening of your bones? Aging of your skin?  Yellowing of your teeth and nails?  CALCIUM AND VITAMIN D:  Adequate intake of calcium and Vitamin D are recommended.  The recommendations for exact amounts of these supplements seem to change often, but generally speaking 1,000 mg of calcium (between diet and supplement) and 800 units of Vitamin D per day seems prudent. Certain women may benefit from higher intake of Vitamin D.  If you are among these women, your doctor will have told you during your visit.    PAP SMEARS:  Pap smears, to check for cervical cancer or precancers,  have traditionally been done yearly, although recent scientific advances have shown that most women can have pap smears less often.  However, every woman still should have a physical exam from her gynecologist every year. It will include a breast check, inspection of the vulva and vagina to check for abnormal growths or skin changes, a visual exam of the cervix, and then an exam to evaluate the size and shape of the uterus and  ovaries.  And after 35 years of age, a rectal exam is indicated to check for rectal cancers. We will also provide age appropriate advice regarding health maintenance, like when you should have certain vaccines, screening for sexually transmitted diseases, bone density testing, colonoscopy, mammograms, etc.   MAMMOGRAMS:  All women over 40 years old should have a yearly mammogram. Many facilities now offer a "3D" mammogram, which may cost around $50 extra out of pocket. If possible,  we recommend you accept the option to have the 3D mammogram performed.  It both reduces the number of women who will be called back for extra views which then turn out to be normal, and it is better than the routine mammogram at detecting truly abnormal areas.    COLON CANCER SCREENING: Now recommend starting at age 45. At this time colonoscopy is not covered for routine screening until 50. There are take home tests that can be done between 45-49.   COLONOSCOPY:  Colonoscopy to screen for colon cancer is recommended for all women at age 50.  We know, you hate the idea of the prep.  We agree, BUT, having colon cancer and not knowing it is worse!!  Colon cancer so often starts as a polyp that can be seen and removed at colonscopy, which can quite literally save your life!  And if your first colonoscopy is normal and you have no family history of colon cancer, most women don't have to have it again for   10 years.  Once every ten years, you can do something that may end up saving your life, right?  We will be happy to help you get it scheduled when you are ready.  Be sure to check your insurance coverage so you understand how much it will cost.  It may be covered as a preventative service at no cost, but you should check your particular policy.      Breast Self-Awareness Breast self-awareness means being familiar with how your breasts look and feel. It involves checking your breasts regularly and reporting any changes to your  health care provider. Practicing breast self-awareness is important. A change in your breasts can be a sign of a serious medical problem. Being familiar with how your breasts look and feel allows you to find any problems early, when treatment is more likely to be successful. All women should practice breast self-awareness, including women who have had breast implants. How to do a breast self-exam One way to learn what is normal for your breasts and whether your breasts are changing is to do a breast self-exam. To do a breast self-exam: Look for Changes  1. Remove all the clothing above your waist. 2. Stand in front of a mirror in a room with good lighting. 3. Put your hands on your hips. 4. Push your hands firmly downward. 5. Compare your breasts in the mirror. Look for differences between them (asymmetry), such as: ? Differences in shape. ? Differences in size. ? Puckers, dips, and bumps in one breast and not the other. 6. Look at each breast for changes in your skin, such as: ? Redness. ? Scaly areas. 7. Look for changes in your nipples, such as: ? Discharge. ? Bleeding. ? Dimpling. ? Redness. ? A change in position. Feel for Changes Carefully feel your breasts for lumps and changes. It is best to do this while lying on your back on the floor and again while sitting or standing in the shower or tub with soapy water on your skin. Feel each breast in the following way:  Place the arm on the side of the breast you are examining above your head.  Feel your breast with the other hand.  Start in the nipple area and make  inch (2 cm) overlapping circles to feel your breast. Use the pads of your three middle fingers to do this. Apply light pressure, then medium pressure, then firm pressure. The light pressure will allow you to feel the tissue closest to the skin. The medium pressure will allow you to feel the tissue that is a little deeper. The firm pressure will allow you to feel the tissue  close to the ribs.  Continue the overlapping circles, moving downward over the breast until you feel your ribs below your breast.  Move one finger-width toward the center of the body. Continue to use the  inch (2 cm) overlapping circles to feel your breast as you move slowly up toward your collarbone.  Continue the up and down exam using all three pressures until you reach your armpit.  Write Down What You Find  Write down what is normal for each breast and any changes that you find. Keep a written record with breast changes or normal findings for each breast. By writing this information down, you do not need to depend only on memory for size, tenderness, or location. Write down where you are in your menstrual cycle, if you are still menstruating. If you are having trouble noticing differences   in your breasts, do not get discouraged. With time you will become more familiar with the variations in your breasts and more comfortable with the exam. How often should I examine my breasts? Examine your breasts every month. If you are breastfeeding, the best time to examine your breasts is after a feeding or after using a breast pump. If you menstruate, the best time to examine your breasts is 5-7 days after your period is over. During your period, your breasts are lumpier, and it may be more difficult to notice changes. When should I see my health care provider? See your health care provider if you notice:  A change in shape or size of your breasts or nipples.  A change in the skin of your breast or nipples, such as a reddened or scaly area.  Unusual discharge from your nipples.  A lump or thick area that was not there before.  Pain in your breasts.  Anything that concerns you.  

## 2019-01-06 LAB — COMPREHENSIVE METABOLIC PANEL
ALT: 27 IU/L (ref 0–32)
AST: 21 IU/L (ref 0–40)
Albumin/Globulin Ratio: 1.7 (ref 1.2–2.2)
Albumin: 4.5 g/dL (ref 3.8–4.8)
Alkaline Phosphatase: 74 IU/L (ref 39–117)
BUN/Creatinine Ratio: 11 (ref 9–23)
BUN: 7 mg/dL (ref 6–20)
Bilirubin Total: 0.4 mg/dL (ref 0.0–1.2)
CO2: 23 mmol/L (ref 20–29)
Calcium: 9.2 mg/dL (ref 8.7–10.2)
Chloride: 102 mmol/L (ref 96–106)
Creatinine, Ser: 0.66 mg/dL (ref 0.57–1.00)
GFR calc Af Amer: 133 mL/min/{1.73_m2} (ref >59)
GFR calc non Af Amer: 116 mL/min/{1.73_m2} (ref >59)
Globulin, Total: 2.6 g/dL (ref 1.5–4.5)
Glucose: 81 mg/dL (ref 65–99)
Potassium: 3.8 mmol/L (ref 3.5–5.2)
Sodium: 139 mmol/L (ref 134–144)
Total Protein: 7.1 g/dL (ref 6.0–8.5)

## 2019-01-06 LAB — CBC
Hematocrit: 38.8 % (ref 34.0–46.6)
Hemoglobin: 12.8 g/dL (ref 11.1–15.9)
MCH: 28.4 pg (ref 26.6–33.0)
MCHC: 33 g/dL (ref 31.5–35.7)
MCV: 86 fL (ref 79–97)
Platelets: 363 10*3/uL (ref 150–450)
RBC: 4.5 x10E6/uL (ref 3.77–5.28)
RDW: 12.9 % (ref 11.7–15.4)
WBC: 10.3 10*3/uL (ref 3.4–10.8)

## 2019-01-06 LAB — LIPID PANEL
Chol/HDL Ratio: 4.1 ratio (ref 0.0–4.4)
Cholesterol, Total: 185 mg/dL (ref 100–199)
HDL: 45 mg/dL (ref >39)
LDL Calculated: 111 mg/dL — ABNORMAL HIGH (ref 0–99)
Triglycerides: 146 mg/dL (ref 0–149)
VLDL Cholesterol Cal: 29 mg/dL (ref 5–40)

## 2019-01-06 LAB — HEMOGLOBIN A1C
Est. average glucose Bld gHb Est-mCnc: 111 mg/dL
Hgb A1c MFr Bld: 5.5 % (ref 4.8–5.6)

## 2019-01-06 LAB — TSH: TSH: 2.67 u[IU]/mL (ref 0.450–4.500)

## 2019-08-01 ENCOUNTER — Other Ambulatory Visit: Payer: Self-pay

## 2019-08-01 ENCOUNTER — Ambulatory Visit: Payer: 59 | Admitting: Obstetrics and Gynecology

## 2019-08-01 ENCOUNTER — Telehealth: Payer: Self-pay | Admitting: *Deleted

## 2019-08-01 ENCOUNTER — Encounter: Payer: Self-pay | Admitting: Obstetrics and Gynecology

## 2019-08-01 VITALS — BP 122/64 | HR 73 | Temp 98.9°F | Ht 68.0 in | Wt 319.0 lb

## 2019-08-01 DIAGNOSIS — Z9889 Other specified postprocedural states: Secondary | ICD-10-CM | POA: Diagnosis not present

## 2019-08-01 DIAGNOSIS — N6320 Unspecified lump in the left breast, unspecified quadrant: Secondary | ICD-10-CM

## 2019-08-01 DIAGNOSIS — N6310 Unspecified lump in the right breast, unspecified quadrant: Secondary | ICD-10-CM

## 2019-08-01 NOTE — Telephone Encounter (Signed)
-----   Message from Romualdo Bolk, MD sent at 08/01/2019 10:53 AM EST ----- Please set her up for diagnostic breast imaging. See note, bilateral breast lumps. Patient is very anxious.  Thanks, Noreene Larsson

## 2019-08-01 NOTE — Telephone Encounter (Signed)
Spoke with Information systems manager at [facility]. Patient scheduled for first available appt on 08/18/19 at 7:45am.   Patient placed in [facility] hold.   Order faxed to [facility].   Spoke with patient, advised of appt date and time. Patient is agreeable and verbalizes understanding.   Routing to provider for final review. Patient is agreeable to disposition. Will close encounter.

## 2019-08-01 NOTE — Progress Notes (Signed)
GYNECOLOGY  VISIT   HPI: 34 y.o.   Legally Separated White or Caucasian Not Hispanic or Latino  female   [phone] with Patient's last menstrual period was 07/21/2019.   here for   Left breast lump. Patient states that she can only feel one on the left side but her mother could feel some thing on the right side as well.  She noticed a left breast lump on the inner breast 3.5 weeks ago, tender, some pain. No change since she first noted it. Mom did a breast exam this weekend and also felt the lump and noticed a lump on the inner right breast, not tender.   GYNECOLOGIC HISTORY: Patient's last menstrual period was 07/21/2019. Contraception:Tubal ligation  Menopausal hormone therapy:none        OB History    Gravida  2   Para  2   Term  2   Preterm  0   AB  0   Living  2     SAB  0   TAB  0   Ectopic  0   Multiple  0   Live Births  2              Patient Active Problem List   Diagnosis Date Noted  . Weight gain 12/21/2015  . Irregular menses 12/21/2015    Past Medical History:  Diagnosis Date  . Abnormal Pap smear of cervix ~2005   colpo biopsy  . Anxiety   . Depression   . Hemorrhage after delivery of fetus 2011  . SVD (spontaneous vaginal delivery)    x 2    Past Surgical History:  Procedure Laterality Date  . APPENDECTOMY  2003  . BREAST SURGERY Bilateral 11/14/2002   Breast Reduction   . COLPOSCOPY  ~2005   Normal - per pt  . KNEE SURGERY     left  . LAPAROSCOPIC BILATERAL SALPINGECTOMY N/A 02/28/2016   Procedure: LAPAROSCOPIC BILATERAL SALPINGECTOMY;  Surgeon: Salvadore Dom, MD;  Location: [facility];  Service: Gynecology;  Laterality: N/A;  Beth RNFA  . TUBAL LIGATION    . WISDOM TOOTH EXTRACTION      Current Outpatient Medications  Medication Sig Dispense Refill  . aspirin-acetaminophen-caffeine (EXCEDRIN MIGRAINE) 250-250-65 MG tablet Take 2 tablets by mouth every 6 (six) hours as needed for headache.    . Multiple Vitamin (MULTIVITAMIN)  tablet Take 1 tablet by mouth at bedtime.      No current facility-administered medications for this visit.     ALLERGIES: Latex  Family History  Adopted: Yes    Social History   Socioeconomic History  . Marital status: Legally Separated    Spouse name: Not on file  . Number of children: Not on file  . Years of education: Not on file  . Highest education level: Not on file  Occupational History  . Not on file  Tobacco Use  . Smoking status: Former Smoker    Packs/day: 0.25    Years: 7.00    Pack years: 1.75    Types: Cigarettes    Quit date: 07/26/2014    Years since quitting: 5.0  . Smokeless tobacco: Never Used  Substance and Sexual Activity  . Alcohol use: Yes    Alcohol/week: 1.0 - 2.0 standard drinks    Types: 1 - 2 Glasses of wine per week  . Drug use: No  . Sexual activity: Yes    Birth control/protection: Surgical    Comment: tubal ligation  Other Topics Concern  .  Not on file  Social History Narrative  . Not on file   Social Determinants of Health   Financial Resource Strain:   . Difficulty of Paying Living Expenses: Not on file  Food Insecurity:   . Worried About Programme researcher, broadcasting/film/video in the Last Year: Not on file  . Ran Out of Food in the Last Year: Not on file  Transportation Needs:   . Lack of Transportation (Medical): Not on file  . Lack of Transportation (Non-Medical): Not on file  Physical Activity:   . Days of Exercise per Week: Not on file  . Minutes of Exercise per Session: Not on file  Stress:   . Feeling of Stress : Not on file  Social Connections:   . Frequency of Communication with Friends and Family: Not on file  . Frequency of Social Gatherings with Friends and Family: Not on file  . Attends Religious Services: Not on file  . Active Member of Clubs or Organizations: Not on file  . Attends Banker Meetings: Not on file  . Marital Status: Not on file  Intimate Partner Violence:   . Fear of Current or Ex-Partner: Not on  file  . Emotionally Abused: Not on file  . Physically Abused: Not on file  . Sexually Abused: Not on file    Review of Systems  All other systems reviewed and are negative.   PHYSICAL EXAMINATION:    BP 122/64   Pulse 73   Temp 98.9 F (37.2 C)   Ht 5\' 8"  (1.727 m)   Wt (!) 319 lb (144.7 kg)   LMP 07/21/2019   SpO2 97%   BMI 48.50 kg/m     General appearance: alert, cooperative and appears stated age Breasts: evidence of bilateral breast reduction. Inverted left nipple, no change. In the left breast at 7-8 o'clock are 2 tender pea sized lumps. In the right breast at 3 o'clock is a pea sized, not tender mobile lump.   No axillary or supraclavicular adenopathy.   ASSESSMENT Bilateral breast lumps    PLAN Diagnostic breast imaging.    An After Visit Summary was printed and given to the patient.

## 2019-08-31 ENCOUNTER — Telehealth: Payer: Self-pay | Admitting: Obstetrics and Gynecology

## 2019-08-31 NOTE — Telephone Encounter (Signed)
Spoke to patient and gave normal results. Scheduled for follow up breast exam for 11/02/19 at 4PM.

## 2019-08-31 NOTE — Telephone Encounter (Signed)
The patient's breast imaging was normal. Please set her up for a repeat breast exam at the end of May. Thanks

## 2019-11-01 ENCOUNTER — Other Ambulatory Visit: Payer: Self-pay

## 2019-11-02 ENCOUNTER — Ambulatory Visit: Payer: 59 | Admitting: Obstetrics and Gynecology

## 2019-11-02 ENCOUNTER — Encounter: Payer: Self-pay | Admitting: Obstetrics and Gynecology

## 2019-11-02 VITALS — BP 122/63 | HR 107 | Temp 98.4°F | Ht 68.0 in | Wt 324.0 lb

## 2019-11-02 DIAGNOSIS — N6011 Diffuse cystic mastopathy of right breast: Secondary | ICD-10-CM | POA: Diagnosis not present

## 2019-11-02 DIAGNOSIS — N6012 Diffuse cystic mastopathy of left breast: Secondary | ICD-10-CM

## 2019-11-02 DIAGNOSIS — Z9889 Other specified postprocedural states: Secondary | ICD-10-CM

## 2019-11-02 DIAGNOSIS — N6310 Unspecified lump in the right breast, unspecified quadrant: Secondary | ICD-10-CM | POA: Diagnosis not present

## 2019-11-02 DIAGNOSIS — N6320 Unspecified lump in the left breast, unspecified quadrant: Secondary | ICD-10-CM

## 2019-11-02 NOTE — Progress Notes (Signed)
GYNECOLOGY  VISIT   HPI: 34 y.o.   Legally Separated White or Caucasian Not Hispanic or Latino  female   [phone] with Patient's last menstrual period was 10/19/2019.   here for follow up breast check. She has no pain.  Patient states that she keeps finding new things.    She has a h/o breast reduction. She was seen in 2/21 with c/o breast lumps.   Breast exam at the time revealed: Inverted left nipple, no change. In the left breast at 7-8 o'clock are 2 tender pea sized lumps. In the right breast at 3 o'clock is a pea sized, not tender mobile lump.   No axillary or supraclavicular adenopathy.  Diagnostic breast imaging was negative.   GYNECOLOGIC HISTORY: Patient's last menstrual period was 10/19/2019. Contraception: none Menopausal hormone therapy: none        OB History    Gravida  2   Para  2   Term  2   Preterm  0   AB  0   Living  2     SAB  0   TAB  0   Ectopic  0   Multiple  0   Live Births  2              Patient Active Problem List   Diagnosis Date Noted  . Weight gain 12/21/2015  . Irregular menses 12/21/2015    Past Medical History:  Diagnosis Date  . Abnormal Pap smear of cervix ~2005   colpo biopsy  . Anxiety   . Depression   . Hemorrhage after delivery of fetus 2011  . SVD (spontaneous vaginal delivery)    x 2    Past Surgical History:  Procedure Laterality Date  . APPENDECTOMY  2003  . BREAST SURGERY Bilateral 11/14/2002   Breast Reduction   . COLPOSCOPY  ~2005   Normal - per pt  . KNEE SURGERY     left  . LAPAROSCOPIC BILATERAL SALPINGECTOMY N/A 02/28/2016   Procedure: LAPAROSCOPIC BILATERAL SALPINGECTOMY;  Surgeon: Romualdo Bolk, MD;  Location: [facility];  Service: Gynecology;  Laterality: N/A;  Beth RNFA  . TUBAL LIGATION    . WISDOM TOOTH EXTRACTION      Current Outpatient Medications  Medication Sig Dispense Refill  . aspirin-acetaminophen-caffeine (EXCEDRIN MIGRAINE) 250-250-65 MG tablet Take 2 tablets by mouth every 6  (six) hours as needed for headache.    . Multiple Vitamin (MULTIVITAMIN) tablet Take 1 tablet by mouth at bedtime.      No current facility-administered medications for this visit.     ALLERGIES: Latex  Family History  Adopted: Yes    Social History   Socioeconomic History  . Marital status: Legally Separated    Spouse name: Not on file  . Number of children: Not on file  . Years of education: Not on file  . Highest education level: Not on file  Occupational History  . Not on file  Tobacco Use  . Smoking status: Former Smoker    Packs/day: 0.25    Years: 7.00    Pack years: 1.75    Types: Cigarettes    Quit date: 07/26/2014    Years since quitting: 5.2  . Smokeless tobacco: Never Used  Substance and Sexual Activity  . Alcohol use: Yes    Alcohol/week: 1.0 - 2.0 standard drinks    Types: 1 - 2 Glasses of wine per week  . Drug use: No  . Sexual activity: Yes    Birth  control/protection: Surgical    Comment: tubal ligation  Other Topics Concern  . Not on file  Social History Narrative  . Not on file   Social Determinants of Health   Financial Resource Strain:   . Difficulty of Paying Living Expenses:   Food Insecurity:   . Worried About Charity fundraiser in the Last Year:   . Arboriculturist in the Last Year:   Transportation Needs:   . Film/video editor (Medical):   [name] Lack of Transportation (Non-Medical):   Physical Activity:   . Days of Exercise per Week:   . Minutes of Exercise per Session:   Stress:   . Feeling of Stress :   Social Connections:   . Frequency of Communication with Friends and Family:   . Frequency of Social Gatherings with Friends and Family:   . Attends Religious Services:   . Active Member of Clubs or Organizations:   . Attends Archivist Meetings:   [name] Marital Status:   Intimate Partner Violence:   . Fear of Current or Ex-Partner:   . Emotionally Abused:   [name] Physically Abused:   . Sexually Abused:     Review  of Systems  All other systems reviewed and are negative.   PHYSICAL EXAMINATION:    BP 122/63   Pulse (!) 107   Temp 98.4 F (36.9 C)   Ht 5\' 8"  (1.727 m)   Wt (!) 324 lb (147 kg)   LMP 10/19/2019   SpO2 98%   BMI 49.26 kg/m     General appearance: alert, cooperative and appears stated age Breasts: evidence of bilateral breast reduction. Inverted left nipple, no change. Stable left breast lumps at 7-8 oclock near the periphery (2 pea sized mobile lumps). Stable small smooth lump in the right breast at 3 o'clock. Bilateral fibrocystic changes.  No axillary or supraclavicular adenopathy.  ASSESSMENT Stable bilateral breast lumps, negative imaging Fibrocystic breast changes    PLAN Patient reassured Call with any concerns Information given.    An After Visit Summary was printed and given to the patient.

## 2019-11-02 NOTE — Patient Instructions (Signed)
Fibrocystic Breast Changes  Fibrocystic breast changes are changes that can make your breasts swollen or painful. These changes happen when tiny sacs of fluid (cysts) form in the breast. This is a common condition. It does not mean that you have cancer. It usually happens because of hormone changes during a monthly period. Follow these instructions at home:  Check your breasts after every monthly period. If you do not have monthly periods, check your breasts on the first day of every month. Check for: ? Soreness. ? New swelling or puffiness. ? A change in breast size. ? A change in a lump that was already there.  Take over-the-counter and prescription medicines only as told by your doctor.  Wear a support or sports bra that fits well. Wear this support especially when you are exercising.  Avoid or have less caffeine, fat, and sugar in what you eat and drink as told by your doctor. Contact a doctor if:  You have fluid coming from your nipple, especially if the fluid has blood in it.  You have new lumps or bumps in your breast.  Your breast gets puffy, red, and painful.  You have changes in how your breast looks.  Your nipple looks flat or it sinks into your breast. Get help right away if:  Your breast turns red, and the redness is spreading. Summary  Fibrocystic breast changes are changes that can make your breasts swollen or painful.  This condition can happen when you have hormone changes during your monthly period.  With this condition, it is important to check your breasts after every monthly period. If you do not have monthly periods, check your breasts on the first day of every month. This information is not intended to replace advice given to you by your health care provider. Make sure you discuss any questions you have with your health care provider. Document Revised: 09/16/2018 Document Reviewed: 02/07/2016 Elsevier Patient Education  2020 [employer]  

## 2020-01-05 ENCOUNTER — Encounter: Payer: Self-pay | Admitting: Obstetrics and Gynecology

## 2020-01-09 ENCOUNTER — Ambulatory Visit: Payer: 59 | Admitting: Obstetrics and Gynecology

## 2020-01-16 NOTE — Progress Notes (Signed)
34 y.o. [id number] Legally Separated White or Caucasian Not Hispanic or Latino female here for annual exam.  Same long term partner (couple of years), live together part time.  Period Cycle (Days): 30 Period Duration (Days): 4-6 Period Pattern: Regular Menstrual Flow: Moderate Menstrual Control: Tampon Menstrual Control Change Freq (Hours): 3-4 Dysmenorrhea: (!) Mild Dysmenorrhea Symptoms: Cramping   She was seen in 2/21 with c/o breast lumps. H/O breast reduction. Breast exam at the time revealed: Inverted left nipple, no change. In the left breast at 7-8 o'clock are 2 tender pea sized lumps. In the right breast at 3 o'clock is a pea sized, not tender mobile lump.  No axillary or supraclavicular adenopathy. Diagnostic breast imaging was negative F/U exam in 5/21 was stable.   Patient's last menstrual period was 01/08/2020.          Sexually active: Yes.    The current method of family planning is tubal ligation.    Exercising: Yes.    The patient does not participate in regular exercise at present. Smoker:  no  Health Maintenance: Pap:  12/30/2017 WNL NEG HPV, 12/15/14 Neg. HR HPV:neg History of abnormal Pap:  Yes CIN I 2005 MMG:  7/29/21inconclusive other imaging needed. Ultrasound was negative.  BMD:   Never  Colonoscopy: never  TDaP:  2011 Gardasil: Patient thinks she has completed   reports that she quit smoking about 5 years ago. Her smoking use included cigarettes. She has a 1.75 pack-year smoking history. She has never used smokeless tobacco. She reports current alcohol use of about 1.0 - 2.0 standard drink of alcohol per week. She reports that she does not use drugs. She works at [employer]. Kids are7(girl) and10(boy).She has full custody of her kids, Ex seems them at most every other weekend.  Past Medical History:  Diagnosis Date  . Abnormal Pap smear of cervix ~2005   colpo biopsy  . Anxiety   . Depression   . Hemorrhage after delivery of fetus 2011  .  SVD (spontaneous vaginal delivery)    x 2    Past Surgical History:  Procedure Laterality Date  . APPENDECTOMY  2003  . BREAST SURGERY Bilateral 11/14/2002   Breast Reduction   . COLPOSCOPY  ~2005   Normal - per pt  . KNEE SURGERY     left  . LAPAROSCOPIC BILATERAL SALPINGECTOMY N/A 02/28/2016   Procedure: LAPAROSCOPIC BILATERAL SALPINGECTOMY;  Surgeon: Romualdo Bolk, MD;  Location: [facility];  Service: Gynecology;  Laterality: N/A;  Beth RNFA  . TUBAL LIGATION    . WISDOM TOOTH EXTRACTION      Current Outpatient Medications  Medication Sig Dispense Refill  . aspirin-acetaminophen-caffeine (EXCEDRIN MIGRAINE) 250-250-65 MG tablet Take 2 tablets by mouth every 6 (six) hours as needed for headache.    . Multiple Vitamin (MULTIVITAMIN) tablet Take 1 tablet by mouth at bedtime.      No current facility-administered medications for this visit.    Family History  Adopted: Yes  Problem Relation Age of Onset  . Stroke Maternal Grandmother 45  . Heart disease Maternal Grandmother   Adopted, but found out information about birth Mom's side.   Review of Systems  Psychiatric/Behavioral: The patient is nervous/anxious.   All other systems reviewed and are negative.   Exam:   BP 118/74   Pulse (!) 109   Ht 5\' 8"  (1.727 m)   Wt (!) 323 lb (146.5 kg)   LMP 01/08/2020   SpO2 98%   BMI 49.11  kg/m   Weight change: @WEIGHTCHANGE @ Height:   Height: 5\' 8"  (172.7 cm)  Ht Readings from Last 3 Encounters:  01/17/20 5\' 8"  (1.727 m)  11/02/19 5\' 8"  (1.727 m)  08/01/19 5\' 8"  (1.727 m)    General appearance: alert, cooperative and appears stated age Head: Normocephalic, without obvious abnormality, atraumatic Neck: no adenopathy, supple, symmetrical, trachea midline and thyroid normal to inspection and palpation Lungs: clear to auscultation bilaterally Cardiovascular: regular rate and rhythm Breasts: normal appearance, no masses or tenderness, stable luminess in the region of prior  breast reduction, specifically at 3 o'clock in the right breast.  Abdomen: soft, non-tender; non distended,  no masses,  no organomegaly Extremities: extremities normal, atraumatic, no cyanosis or edema Skin: Skin color, texture, turgor normal. No rashes or lesions Lymph nodes: Cervical, supraclavicular, and axillary nodes normal. No abnormal inguinal nodes palpated Neurologic: Grossly normal   Pelvic: External genitalia:  no lesions              Urethra:  normal appearing urethra with no masses, tenderness or lesions              Bartholins and Skenes: normal                 Vagina: normal appearing vagina with normal color and discharge, no lesions              Cervix: no lesions               Bimanual Exam:  Uterus:  no masses or tenderness              Adnexa: no mass, fullness, tenderness               Rectovaginal: Confirms               Anus:  normal sphincter tone, no lesions  Shanon Petty chaperoned for the exam.  A:  Well Woman with normal exam  BMI 49, up 22 lbs. She is trying to limit her carbs, hard to find time to exercise. Trying to watch what she eats.   P:   No pap this year   TDAP due  Screening labs, HgbAc, TSH  Discussed breast self exam  Discussed calcium and vit D intake  Number given for the weight loss clinic.

## 2020-01-17 ENCOUNTER — Ambulatory Visit: Payer: 59 | Admitting: Obstetrics and Gynecology

## 2020-01-17 ENCOUNTER — Encounter: Payer: Self-pay | Admitting: Obstetrics and Gynecology

## 2020-01-17 ENCOUNTER — Other Ambulatory Visit: Payer: Self-pay

## 2020-01-17 VITALS — BP 118/74 | HR 109 | Ht 68.0 in | Wt 323.0 lb

## 2020-01-17 DIAGNOSIS — Z0000 Encounter for general adult medical examination without abnormal findings: Secondary | ICD-10-CM

## 2020-01-17 DIAGNOSIS — Z6841 Body Mass Index (BMI) 40.0 and over, adult: Secondary | ICD-10-CM

## 2020-01-17 DIAGNOSIS — Z01419 Encounter for gynecological examination (general) (routine) without abnormal findings: Secondary | ICD-10-CM | POA: Diagnosis not present

## 2020-01-17 DIAGNOSIS — Z23 Encounter for immunization: Secondary | ICD-10-CM | POA: Diagnosis not present

## 2020-01-17 NOTE — Patient Instructions (Signed)
EXERCISE AND DIET:  We recommended that you start or continue a regular exercise program for good health. Regular exercise means any activity that makes your heart beat faster and makes you sweat.  We recommend exercising at least 30 minutes per day at least 3 days a week, preferably 4 or 5.  We also recommend a diet low in fat and sugar.  Inactivity, poor dietary choices and obesity can cause diabetes, heart attack, stroke, and kidney damage, among others.    ALCOHOL AND SMOKING:  Women should limit their alcohol intake to no more than 7 drinks/beers/glasses of wine (combined, not each!) per week. Moderation of alcohol intake to this level decreases your risk of breast cancer and liver damage. And of course, no recreational drugs are part of a healthy lifestyle.  And absolutely no smoking or even second hand smoke. Most people know smoking can cause heart and lung diseases, but did you know it also contributes to weakening of your bones? Aging of your skin?  Yellowing of your teeth and nails?  CALCIUM AND VITAMIN D:  Adequate intake of calcium and Vitamin D are recommended.  The recommendations for exact amounts of these supplements seem to change often, but generally speaking 1,000 mg of calcium (between diet and supplement) and 800 units of Vitamin D per day seems prudent. Certain women may benefit from higher intake of Vitamin D.  If you are among these women, your doctor will have told you during your visit.    PAP SMEARS:  Pap smears, to check for cervical cancer or precancers,  have traditionally been done yearly, although recent scientific advances have shown that most women can have pap smears less often.  However, every woman still should have a physical exam from her gynecologist every year. It will include a breast check, inspection of the vulva and vagina to check for abnormal growths or skin changes, a visual exam of the cervix, and then an exam to evaluate the size and shape of the uterus and  ovaries.  And after 36 years of age, a rectal exam is indicated to check for rectal cancers. We will also provide age appropriate advice regarding health maintenance, like when you should have certain vaccines, screening for sexually transmitted diseases, bone density testing, colonoscopy, mammograms, etc.   MAMMOGRAMS:  All women over 40 years old should have a yearly mammogram. Many facilities now offer a "3D" mammogram, which may cost around $50 extra out of pocket. If possible,  we recommend you accept the option to have the 3D mammogram performed.  It both reduces the number of women who will be called back for extra views which then turn out to be normal, and it is better than the routine mammogram at detecting truly abnormal areas.    COLON CANCER SCREENING: Now recommend starting at age 45. At this time colonoscopy is not covered for routine screening until 50. There are take home tests that can be done between 45-49.   COLONOSCOPY:  Colonoscopy to screen for colon cancer is recommended for all women at age 50.  We know, you hate the idea of the prep.  We agree, BUT, having colon cancer and not knowing it is worse!!  Colon cancer so often starts as a polyp that can be seen and removed at colonscopy, which can quite literally save your life!  And if your first colonoscopy is normal and you have no family history of colon cancer, most women don't have to have it again for   10 years.  Once every ten years, you can do something that may end up saving your life, right?  We will be happy to help you get it scheduled when you are ready.  Be sure to check your insurance coverage so you understand how much it will cost.  It may be covered as a preventative service at no cost, but you should check your particular policy.      Breast Self-Awareness Breast self-awareness means being familiar with how your breasts look and feel. It involves checking your breasts regularly and reporting any changes to your  health care provider. Practicing breast self-awareness is important. A change in your breasts can be a sign of a serious medical problem. Being familiar with how your breasts look and feel allows you to find any problems early, when treatment is more likely to be successful. All women should practice breast self-awareness, including women who have had breast implants. How to do a breast self-exam One way to learn what is normal for your breasts and whether your breasts are changing is to do a breast self-exam. To do a breast self-exam: Look for Changes  1. Remove all the clothing above your waist. 2. Stand in front of a mirror in a room with good lighting. 3. Put your hands on your hips. 4. Push your hands firmly downward. 5. Compare your breasts in the mirror. Look for differences between them (asymmetry), such as: ? Differences in shape. ? Differences in size. ? Puckers, dips, and bumps in one breast and not the other. 6. Look at each breast for changes in your skin, such as: ? Redness. ? Scaly areas. 7. Look for changes in your nipples, such as: ? Discharge. ? Bleeding. ? Dimpling. ? Redness. ? A change in position. Feel for Changes Carefully feel your breasts for lumps and changes. It is best to do this while lying on your back on the floor and again while sitting or standing in the shower or tub with soapy water on your skin. Feel each breast in the following way:  Place the arm on the side of the breast you are examining above your head.  Feel your breast with the other hand.  Start in the nipple area and make  inch (2 cm) overlapping circles to feel your breast. Use the pads of your three middle fingers to do this. Apply light pressure, then medium pressure, then firm pressure. The light pressure will allow you to feel the tissue closest to the skin. The medium pressure will allow you to feel the tissue that is a little deeper. The firm pressure will allow you to feel the tissue  close to the ribs.  Continue the overlapping circles, moving downward over the breast until you feel your ribs below your breast.  Move one finger-width toward the center of the body. Continue to use the  inch (2 cm) overlapping circles to feel your breast as you move slowly up toward your collarbone.  Continue the up and down exam using all three pressures until you reach your armpit.  Write Down What You Find  Write down what is normal for each breast and any changes that you find. Keep a written record with breast changes or normal findings for each breast. By writing this information down, you do not need to depend only on memory for size, tenderness, or location. Write down where you are in your menstrual cycle, if you are still menstruating. If you are having trouble noticing differences   in your breasts, do not get discouraged. With time you will become more familiar with the variations in your breasts and more comfortable with the exam. How often should I examine my breasts? Examine your breasts every month. If you are breastfeeding, the best time to examine your breasts is after a feeding or after using a breast pump. If you menstruate, the best time to examine your breasts is 5-7 days after your period is over. During your period, your breasts are lumpier, and it may be more difficult to notice changes. When should I see my health care provider? See your health care provider if you notice:  A change in shape or size of your breasts or nipples.  A change in the skin of your breast or nipples, such as a reddened or scaly area.  Unusual discharge from your nipples.  A lump or thick area that was not there before.  Pain in your breasts.  Anything that concerns you.  

## 2020-01-18 ENCOUNTER — Telehealth: Payer: Self-pay

## 2020-01-18 DIAGNOSIS — R7303 Prediabetes: Secondary | ICD-10-CM

## 2020-01-18 DIAGNOSIS — Z6841 Body Mass Index (BMI) 40.0 and over, adult: Secondary | ICD-10-CM

## 2020-01-18 LAB — COMPREHENSIVE METABOLIC PANEL
ALT: 33 IU/L — ABNORMAL HIGH (ref 0–32)
AST: 19 IU/L (ref 0–40)
Albumin/Globulin Ratio: 1.7 (ref 1.2–2.2)
Albumin: 4.4 g/dL (ref 3.8–4.8)
Alkaline Phosphatase: 74 IU/L (ref 48–121)
BUN/Creatinine Ratio: 13 (ref 9–23)
BUN: 8 mg/dL (ref 6–20)
Bilirubin Total: 0.4 mg/dL (ref 0.0–1.2)
CO2: 23 mmol/L (ref 20–29)
Calcium: 9.1 mg/dL (ref 8.7–10.2)
Chloride: 104 mmol/L (ref 96–106)
Creatinine, Ser: 0.62 mg/dL (ref 0.57–1.00)
GFR calc Af Amer: 135 mL/min/{1.73_m2} (ref >59)
GFR calc non Af Amer: 117 mL/min/{1.73_m2} (ref >59)
Globulin, Total: 2.6 g/dL (ref 1.5–4.5)
Glucose: 107 mg/dL — ABNORMAL HIGH (ref 65–99)
Potassium: 4.5 mmol/L (ref 3.5–5.2)
Sodium: 139 mmol/L (ref 134–144)
Total Protein: 7 g/dL (ref 6.0–8.5)

## 2020-01-18 LAB — CBC
Hematocrit: 39.9 % (ref 34.0–46.6)
Hemoglobin: 13.1 g/dL (ref 11.1–15.9)
MCH: 29.2 pg (ref 26.6–33.0)
MCHC: 32.8 g/dL (ref 31.5–35.7)
MCV: 89 fL (ref 79–97)
Platelets: 349 10*3/uL (ref 150–450)
RBC: 4.48 x10E6/uL (ref 3.77–5.28)
RDW: 13 % (ref 11.7–15.4)
WBC: 8.7 10*3/uL (ref 3.4–10.8)

## 2020-01-18 LAB — TSH: TSH: 1.89 u[IU]/mL (ref 0.450–4.500)

## 2020-01-18 LAB — LIPID PANEL
Chol/HDL Ratio: 4.5 ratio — ABNORMAL HIGH (ref 0.0–4.4)
Cholesterol, Total: 189 mg/dL (ref 100–199)
HDL: 42 mg/dL (ref >39)
LDL Chol Calc (NIH): 123 mg/dL — ABNORMAL HIGH (ref 0–99)
Triglycerides: 134 mg/dL (ref 0–149)
VLDL Cholesterol Cal: 24 mg/dL (ref 5–40)

## 2020-01-18 LAB — HEMOGLOBIN A1C
Est. average glucose Bld gHb Est-mCnc: 120 mg/dL
Hgb A1c MFr Bld: 5.8 % — ABNORMAL HIGH (ref 4.8–5.6)

## 2020-01-18 NOTE — Telephone Encounter (Signed)
-----   Message from Romualdo Bolk, MD sent at 01/18/2020 11:03 AM EDT ----- Please let the patient know that she has prediabetes, an elevated LDL (bad cholesterol), an elevated cholesterol/hdl ration. She also has a very mildly elevated liver function test. I think all of these lab abnormalities are do to her being overweight. I know she is trying to watch what she eats. If at all possible she should try to start exercising as well (difficult with work and being a single parent). I would recommend referral to the prediabetes clinic.  Yesterday I gave her the # to the weight loss clinic as well.  Please send a copy of her labs to her primary

## 2020-01-18 NOTE — Telephone Encounter (Signed)
Spoke with pt. Pt given results and recommendations per Dr Oscar La. Pt agreeable and verbalized understanding. Pt agreeable to have referral to prediabetes clinic. Pt also states is in process of changing PCPs. Labs not sent to current on file. Pt asking for recommendations. Pt requests recommendations be sent via Mychart. Advised will do.  Referral placed  Cc: Rosa  Encounter closed.

## 2020-02-16 ENCOUNTER — Ambulatory Visit: Payer: 59 | Admitting: Dietician

## 2020-10-24 ENCOUNTER — Ambulatory Visit: Payer: 59 | Admitting: Physician Assistant

## 2021-01-16 NOTE — Progress Notes (Signed)
35 y.o. [id number] Divorced White or Caucasian Not Hispanic or Latino female here for annual exam.  Lives with her partner. No dyspareunia.  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   Patient's last menstrual period was 12/21/2020.          Sexually active: Yes.    The current method of family planning is tubal ligation.    Exercising: Yes.     Walking  Smoker:  no  Health Maintenance: Pap:   12/30/2017 WNL NEG HPV, 12/15/14 Neg. HR HPV:neg  History of abnormal Pap:  Yes CIN I 2005 MMG:  7/29/21inconclusive other imaging needed. Ultrasound was negative BMD:   n/a Colonoscopy: n/a  TDaP:  01/17/20  Gardasil: complete per patient    reports that she quit smoking about 6 years ago. Her smoking use included cigarettes. She has a 1.75 pack-year smoking history. She has never used smokeless tobacco. She reports current alcohol use of about 1.0 - 2.0 standard drink per week. She reports that she does not use drugs. She works at [employer]. Kids are 8(girl) and 52 (boy). She has full custody of her kids  Past Medical History:  Diagnosis Date   Abnormal Pap smear of cervix ~2005   colpo biopsy   Anxiety    Depression    Hemorrhage after delivery of fetus 2011   SVD (spontaneous vaginal delivery)    x 2    Past Surgical History:  Procedure Laterality Date   APPENDECTOMY  2003   BREAST SURGERY Bilateral 11/14/2002   Breast Reduction    COLPOSCOPY  ~2005   Normal - per pt   KNEE SURGERY     left   LAPAROSCOPIC BILATERAL SALPINGECTOMY N/A 02/28/2016   Procedure: LAPAROSCOPIC BILATERAL SALPINGECTOMY;  Surgeon: Romualdo Bolk, MD;  Location: [facility];  Service: Gynecology;   Laterality: N/A;  Beth RNFA   TUBAL LIGATION     WISDOM TOOTH EXTRACTION      Current Outpatient Medications  Medication Sig Dispense Refill   aspirin-acetaminophen-caffeine (EXCEDRIN MIGRAINE) 250-250-65 MG tablet Take 2 tablets by mouth every 6 (six) hours as needed for headache.     Multiple Vitamin (MULTIVITAMIN) tablet Take 1 tablet by mouth at bedtime.      No current facility-administered medications for this visit.    Family History  Adopted: Yes  Problem Relation Age of Onset   Stroke Maternal Grandmother 45   Heart disease Maternal Grandmother     Review of Systems  All other systems reviewed and are negative.  Exam:   BP 122/80   Pulse 88   Ht 5\' 8"  (1.727 m)   Wt (!) 323 lb (146.5 kg)   LMP 12/21/2020   SpO2 100%   BMI 49.11 kg/m   Weight change: @WEIGHTCHANGE @ Height:   Height: 5\' 8"  (172.7 cm)  Ht Readings from Last 3 Encounters:  01/17/21 5\' 8"  (1.727 m)  01/17/20 5\' 8"  (1.727 m)  11/02/19 5\' 8"  (1.727 m)    General appearance: alert, cooperative and appears stated age Head: Normocephalic, without obvious abnormality, atraumatic Neck: no adenopathy, supple, symmetrical, trachea midline and thyroid normal to inspection and palpation Lungs: clear to auscultation bilaterally Cardiovascular: regular rate and rhythm Breasts: normal appearance, no masses or tenderness Abdomen:  soft, non-tender; non distended,  no masses,  no organomegaly Extremities: extremities normal, atraumatic, no cyanosis or edema Skin: Skin color, texture, turgor normal. No rashes or lesions Lymph nodes: Cervical, supraclavicular, and axillary nodes normal. No abnormal inguinal nodes palpated Neurologic: Grossly normal   Pelvic: External genitalia:  no lesions              Urethra:  normal appearing urethra with no masses, tenderness or lesions              Bartholins and Skenes: normal                 Vagina: normal appearing vagina with normal color and discharge, no lesions               Cervix: no lesions               Bimanual Exam:  Uterus:   no masses or tenderness              Adnexa: no mass, fullness, tenderness               Rectovaginal: Confirms               Anus:  normal sphincter tone, no lesions  Carolynn Serve chaperoned for the exam.  1. Well woman exam Discussed breast self exam Discussed calcium and vit D intake No pap this year  2. BMI 45.0-49.9, adult (HCC) -We discussed eating healthy, exercise (she is too overextended), & weight watchers. She is establishing care with a primary care provider who is also a dietitian.  - Hemoglobin A1c - Lipid panel - TSH  3. Laboratory exam ordered as part of routine general medical examination - CBC - Comprehensive metabolic panel - Lipid panel

## 2021-01-17 ENCOUNTER — Encounter: Payer: Self-pay | Admitting: Obstetrics and Gynecology

## 2021-01-17 ENCOUNTER — Ambulatory Visit (INDEPENDENT_AMBULATORY_CARE_PROVIDER_SITE_OTHER): Payer: 59 | Admitting: Obstetrics and Gynecology

## 2021-01-17 ENCOUNTER — Other Ambulatory Visit: Payer: Self-pay

## 2021-01-17 VITALS — BP 122/80 | HR 88 | Ht 68.0 in | Wt 323.0 lb

## 2021-01-17 DIAGNOSIS — Z0000 Encounter for general adult medical examination without abnormal findings: Secondary | ICD-10-CM | POA: Diagnosis not present

## 2021-01-17 DIAGNOSIS — M25569 Pain in unspecified knee: Secondary | ICD-10-CM | POA: Insufficient documentation

## 2021-01-17 DIAGNOSIS — M545 Low back pain, unspecified: Secondary | ICD-10-CM | POA: Insufficient documentation

## 2021-01-17 DIAGNOSIS — Z01419 Encounter for gynecological examination (general) (routine) without abnormal findings: Secondary | ICD-10-CM

## 2021-01-17 DIAGNOSIS — Z6841 Body Mass Index (BMI) 40.0 and over, adult: Secondary | ICD-10-CM | POA: Diagnosis not present

## 2021-01-17 DIAGNOSIS — E663 Overweight: Secondary | ICD-10-CM | POA: Insufficient documentation

## 2021-01-17 NOTE — Patient Instructions (Signed)

## 2021-01-18 LAB — HEMOGLOBIN A1C
Hgb A1c MFr Bld: 5.5 % of total Hgb (ref <5.7)
Mean Plasma Glucose: 111 mg/dL
eAG (mmol/L): 6.2 mmol/L

## 2021-01-18 LAB — COMPREHENSIVE METABOLIC PANEL
AG Ratio: 1.5 (calc) (ref 1.0–2.5)
ALT: 30 U/L — ABNORMAL HIGH (ref 6–29)
AST: 20 U/L (ref 10–30)
Albumin: 4.3 g/dL (ref 3.6–5.1)
Alkaline phosphatase (APISO): 65 U/L (ref 31–125)
BUN: 9 mg/dL (ref 7–25)
CO2: 24 mmol/L (ref 20–32)
Calcium: 9.3 mg/dL (ref 8.6–10.2)
Chloride: 105 mmol/L (ref 98–110)
Creat: 0.67 mg/dL (ref 0.50–0.97)
Globulin: 2.8 g/dL (calc) (ref 1.9–3.7)
Glucose, Bld: 96 mg/dL (ref 65–99)
Potassium: 4.1 mmol/L (ref 3.5–5.3)
Sodium: 138 mmol/L (ref 135–146)
Total Bilirubin: 0.6 mg/dL (ref 0.2–1.2)
Total Protein: 7.1 g/dL (ref 6.1–8.1)

## 2021-01-18 LAB — CBC
HCT: 38.9 % (ref 35.0–45.0)
Hemoglobin: 13.1 g/dL (ref 11.7–15.5)
MCH: 29.6 pg (ref 27.0–33.0)
MCHC: 33.7 g/dL (ref 32.0–36.0)
MCV: 88 fL (ref 80.0–100.0)
MPV: 10 fL (ref 7.5–12.5)
Platelets: 360 10*3/uL (ref 140–400)
RBC: 4.42 10*6/uL (ref 3.80–5.10)
RDW: 13.1 % (ref 11.0–15.0)
WBC: 10.7 10*3/uL (ref 3.8–10.8)

## 2021-01-18 LAB — LIPID PANEL
Cholesterol: 179 mg/dL (ref <200)
HDL: 41 mg/dL — ABNORMAL LOW (ref >=50)
LDL Cholesterol (Calc): 113 mg/dL (calc) — ABNORMAL HIGH
Non-HDL Cholesterol (Calc): 138 mg/dL (calc) — ABNORMAL HIGH (ref <130)
Total CHOL/HDL Ratio: 4.4 (calc) (ref <5.0)
Triglycerides: 136 mg/dL (ref <150)

## 2021-01-18 LAB — TSH: TSH: 1.97 mIU/L

## 2021-02-06 ENCOUNTER — Encounter: Payer: Self-pay | Admitting: Obstetrics and Gynecology

## 2021-03-11 NOTE — Progress Notes (Signed)
Amanda Davis is a 35 y.o. female here to establish care.  History of Present Illness:   Chief Complaint  Patient presents with   Establish Care    Weight Loss Issues Amanda Davis is concerned about her weight and lack of progress when it comes to losing weight. She has tried no/low carb diets, decreasing sugar intake, and cutting portions. At this time she finds it difficult to exercise due to her busy schedule;  her children, work, and graduate school. She is committed to making time to increase her physical activity with the right tools.   Hyperlipidemia She is adopted and family history is unknown. Her ex husband recently had a heart attack and she is quite anxious about her own heart health. Last lipid panel performed 01/17/21 and LDL was 113.  Anxiety/Depression Amanda Davis has had depression and anxiety since she was [age] and hasn't been on medication since her 8s. When she was initially prescribed a medication it made her depression worse, which led to her treating her symptoms naturally. Since going through a divorce she has found it difficult to  manage her mood. At this time she doesn't want any medication intervention. SI has subsided since she was [age]. No HI ideations.   Health Maintenance: Influenza Vaccine Last completed 03/12/21. Tdap- Last completed 01/17/20.  Colonoscopy -- N/A Mammogram -- Last completed 01/05/20.  PAP -- Last completed 12/30/17. Due at this time. Bone Density -- N/A Diet -- Currently disciplined in making healthier choices.  Caffeine intake -- N/A Sleep habits -- Not sleeping well.  Exercise -- Doesn't get as much physical movement done due to busy schedule.  Weight -- Would like to lose weight but having trouble.  Mood -- Experiencing stress due to full custody of children and being newly divorced. Alcohol -- 1-2 glasses of wine x week Tobacco -- Smoking cessation 2016. Used tobacco for seven years.   No flowsheet data found.  No  flowsheet data found.   Other providers/specialists: Patient Care Team: [name], [place] as PCP - General (Physician Assistant) Romualdo Bolk, MD as Consulting Physician (Obstetrics and Gynecology)   Past Medical History:  Diagnosis Date   Abnormal Pap smear of cervix ~2005   colpo biopsy   Anxiety    Depression    Hemorrhage after delivery of fetus 2011   SVD (spontaneous vaginal delivery)    x 2     Social History   Tobacco Use   Smoking status: Former    Packs/day: 0.25    Years: 7.00    Pack years: 1.75    Types: Cigarettes    Quit date: 07/26/2014    Years since quitting: 6.6   Smokeless tobacco: Never  Vaping Use   Vaping Use: Never used  Substance Use Topics   Alcohol use: Not Currently    Alcohol/week: 2.0 standard drinks    Types: 2 Cans of beer per week   Drug use: Never    Past Surgical History:  Procedure Laterality Date   APPENDECTOMY  2003   BREAST SURGERY Bilateral 11/14/2002   Breast Reduction    COLPOSCOPY  ~2005   Normal - per pt   KNEE SURGERY     left   LAPAROSCOPIC BILATERAL SALPINGECTOMY N/A 02/28/2016   Procedure: LAPAROSCOPIC BILATERAL SALPINGECTOMY;  Surgeon: Romualdo Bolk, MD;  Location: [facility];  Service: Gynecology;  Laterality: N/A;  Beth RNFA   TUBAL LIGATION     WISDOM TOOTH EXTRACTION      Family  History  Adopted: Yes  Problem Relation Age of Onset   Stroke Maternal Grandmother 45   Heart disease Maternal Grandmother     Allergies  Allergen Reactions   Latex Rash     Current Medications:   Current Outpatient Medications:    aspirin-acetaminophen-caffeine (EXCEDRIN MIGRAINE) 250-250-65 MG tablet, Take 2 tablets by mouth every 6 (six) hours as needed for headache., Disp: , Rfl:    Multiple Vitamin (MULTIVITAMIN) tablet, Take 1 tablet by mouth at bedtime. , Disp: , Rfl:    Review of Systems:   ROS Negative unless otherwise specified per HPI. Vitals:   Vitals:   03/12/21 0823  BP: 112/80  Pulse:  94  Temp: 98.3 F (36.8 C)  TempSrc: Temporal  SpO2: 95%  Weight: (!) 321 lb (145.6 kg)  Height: 5' 8.5" (1.74 m)      Body mass index is 48.1 kg/m.  Physical Exam:   Physical Exam Vitals and nursing note reviewed.  Constitutional:      General: She is not in acute distress.    Appearance: She is well-developed. She is not ill-appearing or toxic-appearing.  Cardiovascular:     Rate and Rhythm: Normal rate and regular rhythm.     Pulses: Normal pulses.     Heart sounds: Normal heart sounds, S1 normal and S2 normal.  Pulmonary:     Effort: Pulmonary effort is normal.     Breath sounds: Normal breath sounds.  Skin:    General: Skin is warm and dry.  Neurological:     Mental Status: She is alert.     GCS: GCS eye subscore is 4. GCS verbal subscore is 5. GCS motor subscore is 6.  Psychiatric:        Mood and Affect: Affect is tearful.        Speech: Speech normal.        Behavior: Behavior normal. Behavior is cooperative.   Assessment and Plan:   Obesity, unspecified classification, unspecified obesity type, unspecified whether serious comorbidity present Referral to dietitian placed today Encouraged patient to start walking x 30 min -- 3 times a week. Follow-up in 1 month, consider starting mounjaro  Hyperlipidemia, unspecified hyperlipidemia type Calcium scoring test ordered for patient Further recommendations after results  Anxiety and depression Denies SI/HI Denies any need for medication or talk therapy at this time I discussed with patient that if they develop any SI, to tell someone immediately and seek medical attention.  I,Amanda Davis,acting as a Neurosurgeon for [employer], PA.,have documented all relevant documentation on the behalf of [name], PA,as directed by  [name], PA while in the presence of [name], [place].  I, [name], [place], have reviewed all documentation for this visit. The documentation on 03/12/21 for the exam,  diagnosis, procedures, and orders are all accurate and complete.   [name], PA-C

## 2021-03-12 ENCOUNTER — Other Ambulatory Visit: Payer: Self-pay

## 2021-03-12 ENCOUNTER — Ambulatory Visit (INDEPENDENT_AMBULATORY_CARE_PROVIDER_SITE_OTHER): Payer: 59 | Admitting: Physician Assistant

## 2021-03-12 ENCOUNTER — Encounter: Payer: Self-pay | Admitting: Physician Assistant

## 2021-03-12 VITALS — BP 112/80 | HR 94 | Temp 98.3°F | Ht 68.5 in | Wt 321.0 lb

## 2021-03-12 DIAGNOSIS — E669 Obesity, unspecified: Secondary | ICD-10-CM | POA: Diagnosis not present

## 2021-03-12 DIAGNOSIS — E785 Hyperlipidemia, unspecified: Secondary | ICD-10-CM

## 2021-03-12 DIAGNOSIS — F32A Depression, unspecified: Secondary | ICD-10-CM | POA: Diagnosis not present

## 2021-03-12 DIAGNOSIS — Z23 Encounter for immunization: Secondary | ICD-10-CM | POA: Diagnosis not present

## 2021-03-12 DIAGNOSIS — F419 Anxiety disorder, unspecified: Secondary | ICD-10-CM | POA: Diagnosis not present

## 2021-03-12 HISTORY — DX: Hyperlipidemia, unspecified: E78.5

## 2021-03-12 NOTE — Patient Instructions (Signed)
It was great to meet you!  Calcium scoring test will be ordered - you will be contacted about this  Dietitian referral placed - you will be contacted about this  Start exercise program -- 30 min of walking 2-3 times a week works!!!  Let's follow-up in 1 month, sooner if you have concerns.  If a referral was placed today, you will be contacted for an appointment. Please note that routine referrals can sometimes take up to 3-4 weeks to process. Please call our office if you haven't heard anything after this time frame.  Take care,  [name] PA-C

## 2021-03-18 ENCOUNTER — Other Ambulatory Visit: Payer: Self-pay

## 2021-03-18 ENCOUNTER — Encounter: Payer: 59 | Attending: Physician Assistant | Admitting: Dietician

## 2021-03-18 ENCOUNTER — Encounter: Payer: Self-pay | Admitting: Dietician

## 2021-03-18 DIAGNOSIS — E785 Hyperlipidemia, unspecified: Secondary | ICD-10-CM | POA: Insufficient documentation

## 2021-03-18 DIAGNOSIS — E669 Obesity, unspecified: Secondary | ICD-10-CM | POA: Insufficient documentation

## 2021-03-18 NOTE — Progress Notes (Signed)
Medical Nutrition Therapy  Appointment Start time:  1110  Appointment End time:  1215 Patient is here today alone. Primary concerns today:  She would like to lost weight.  She has tried low car diet (used to work and does not now).  She needs a consistent meal patter as she skips lunch. BM only 1-2 times per week. Referral diagnosis: obesity and HLD Preferred learning style: no preference indicated Learning readiness: ready, change in progress   NUTRITION ASSESSMENT   Anthropometrics  She reports stable weight for the past 2 years. She has tried low/no carb, decreased sugar, cutting portions in the past.  212 lbs lowest weight at age 35 327 lbs highest adult weight 2022 Gained weight when her and her ex husband separated She is an emotional eater.  Body Composition Scale 03/18/21  Current Body Weight 322.7 lbs  Total Body Fat % 48.2  Visceral Fat 17  Fat-Free Mass % 51.7   Total Body Water % 40.3  Muscle-Mass lbs 36.4  BMI 49.1  Body Fat Displacement          Torso  lbs 96.7         Left Leg  lbs 19.3         Right Leg  lbs 19.3         Left Arm  lbs 9.6         Right Arm   lbs 9.6     Clinical Medical Hx: HLD, obesity Medications: none Labs: 01/17/21:  Glucose 96, Cholesterol 179, HDL 41, LDL 113, Non HDL 138, Triglycerides 136, A1C 5.5% decreased from 5.8% 01/17/2020 Notable Signs/Symptoms: BMI, difficulty sleeping  Lifestyle & Dietary Hx Patient lives with her 42 and 45 yo children.  She does the shopping and cooking.  She works for [employer] from home.  She is working on her masters decree in [employer]. 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  Skipped lunch then as well. Appetite fair.  Not hungry for meals "too focused on what I am doing".  Denies binging.  Estimated daily fluid intake: >64 oz daily oz Supplements: MVI Sleep: 6-7 hours per nigh (she cannot sleep more and is very  tired throughout the day) Stress / self-care: moderate Current average weekly physical activity: She is walking for 30 minutes twice per week (when her children has their sports practice).  24-Hr Dietary Recall First Meal: eggs with 1 pat of butter, honey wheat toast (3 of 7 days) with jelly, coffee with half and half (2-3 oz) OR fruit and eggs on Sunday Snack: none Second Meal: skips Snack: occasional cheese 1-2 oz Third Meal: shepard's pie 1 1/2 cups (made with 80/20 meat-drained, peas and carrot) Snack: none Beverages: water, coffee with half and half, unsweetened iced tea, occasional diet soda  NUTRITION DIAGNOSIS  NB-1.1 Food and nutrition-related knowledge deficit As related to balance of protein, carbohydrates, fat for weight loss.  As evidenced by patient report and diet hx.   NUTRITION INTERVENTION  Nutrition education (E-1) on the following topics:  Weight loss medications such as Ozempic Reginal Lutes) - PA to discuss with patient.  Current patient with reported fair appetite, skips meals. Benefits of staying active Fiber Nutrition density Resources Sample meal planning Hydration My plate balance with non-starchy vegetables most meals and protein every meal  Handouts Provided Include  My Plate Placemat Meal plan card  Learning Style & Readiness for Change Teaching  method utilized: Special educational needs teacher  Demonstrated degree of understanding via: Teach Back  Barriers to learning/adherence to lifestyle change: time  Goals Established by Pt Increase fiber and track Increase water Continue to work on activity Decrease saturated fat Consistent meals Choose whole grains  AVS: Drink more water Caloric Density Consistent meals Protein with each meal (lean meat, fish, eggs or beans) Find ways to increase your fiber intake (minimum 25 grams daily - goal about 40 grams as tolerated) - recommend tracking fiber intake Decrease saturated fat (less half and half, butter, cheese  and choose lean meat, chicken without skin)  Move every 30 minutes.  Stay active.  Great job on starting to walk!  Consider taking kids for a walk on the weekends.  Breakfast ideas: Eggs, vegetables, fresh fruit or whole grain toast [place] yogurt and fruit Oatmeal, with fruit, 2 T walnuts or 1 boiled egg  Lunch ideas: Homemade Soup with raw veges, hummus Salad with black beans, tuna or chicken, portioned dressing. Sprinkle with ground flax seeds. 1/2 sandwich or open faced sandwich, raw veges  You tube or recipes from their Cookbook:1215 Be A Plant-Based Woman Warrior: [employer], Stay Bold, Eat Delicious Book by Gildardo Griffes and Raenette Rover  Eat more Non-Starchy Vegetables These include greens, broccoli, cauliflower, cabbage, carrots, beets, eggplant, peppers, squash and others. Minimize added sugars and refined grains Rethink what you drink.  Choose beverages without added sugar.  Look for 0 carbs on the label. See the list of whole grains below.  Find alternatives to usual sweet treats. Choose whole foods over processed. Make simple meals at home more often than eating out.  Tips to increase fiber in your diet: (All plants have fiber.  Eat a variety. There are more than are on this list.) Slowly increase the amount of fiber you eat to 25-35 grams per day.  (More is fine if you tolerate it.) Fiber from whole grains, nuts and seeds Quinoa, 1/2 cup = 5 grams Bulgur, 1/2 cup = 4.1 grams Popcorn, 3 cups = 3.6 grams Whole Wheat Spaghetti, 1/2 cup = 3.2 grams Barley, 1/2 cup = 3 grams Oatmeal, 1/2 cup = 2 grams Whole Wheat English Muffin = 3 grams Corn, 1/2 cup = 2.1 grams Brown Rice, 1/2 cup = 1.8 grams Flax seeds, 1 Tablespoon = 2.8 grams Chia seeds, 1 Tablespoon = 11 grams Almonds, 1 ounce = 3.5 grams fiber Fiber from legumes Kidney beans, 1/2 cup 7.9 grams Lentils, 1/2 cup = 7.8 grams Pinto beans, 1/2 cup = 7.7 grams Black beans, 1/2 cup = 7.6 grams Lima  beans, 1/2 cup 6.4 grams Chick peas, 1/2 cup = 5.3 grams Black eyed peas, 1/2 cup = 4 grams Fiber from fruits and vegetables Pear, 6 grams Apple. 3.3 grams Raspberries or Blackberries, 3/4 cup = 6 grams Strawberries or Blueberries, 1 cup = 3.4 grams Baked sweet potato 3.8 grams fiber Baked potato with skin 4.4 grams  Peas, 1/2 cup = 4.4 grams  Spinach, 1/2 cup cooked = 3.5 grams  Avocado, 1/2 = 5 grams   MONITORING & EVALUATION Dietary intake, weekly physical activity, and weight in 1-2 months.  Next Steps  Patient is to call for questions.

## 2021-03-22 ENCOUNTER — Other Ambulatory Visit: Payer: Self-pay | Admitting: Physician Assistant

## 2021-03-22 DIAGNOSIS — E785 Hyperlipidemia, unspecified: Secondary | ICD-10-CM

## 2021-03-29 ENCOUNTER — Inpatient Hospital Stay: Admission: RE | Admit: 2021-03-29 | Payer: Self-pay | Source: Ambulatory Visit

## 2021-04-11 NOTE — Progress Notes (Signed)
Amanda Davis is a 35 y.o. female here for a follow up of obesity.   History of Present Illness:   Chief Complaint  Patient presents with   Obesity    She is hear to f/u on weight loss    HPI  Weight Loss  I previously saw Amanda Davis on 03/12/21 to discuss her trouble with weight loss. During this visit Amanda Davis expressed concern for her weight and lack of progress when it came to her weight loss. At this time she was searching for effective tools to aid in her weight loss journey and advice. I provided her with a referral to a dietician at that time.   Amanda Davis has followed up with Amanda Davis, RD,  and learned how to cut down how much she is eating and working on what to eat. Overall she found this to be very beneficial to her. She has increased activity , walking at her son's soccer practice. Currently she expresses she is ready to trial the mounjaro with no concerns whatsoever.   Past Medical History:  Diagnosis Date   Abnormal Pap smear of cervix ~2005   colpo biopsy   Allergy    Latex allergy   Anxiety    Depression    Hemorrhage after delivery of fetus 2011   Hyperlipidemia 03/12/2021   SVD (spontaneous vaginal delivery)    x 2     Social History   Tobacco Use   Smoking status: Former    Packs/day: 0.25    Years: 7.00    Pack years: 1.75    Types: Cigarettes    Quit date: 07/26/2014    Years since quitting: 6.7   Smokeless tobacco: Never  Vaping Use   Vaping Use: Never used  Substance Use Topics   Alcohol use: Yes    Alcohol/week: 1.0 - 2.0 standard drink    Types: 1 - 2 Glasses of wine per week   Drug use: No    Past Surgical History:  Procedure Laterality Date   APPENDECTOMY  2003   BREAST SURGERY Bilateral 11/14/2002   Breast Reduction    COLPOSCOPY  ~2005   Normal - per pt   KNEE SURGERY     left   LAPAROSCOPIC BILATERAL SALPINGECTOMY N/A 02/28/2016   Procedure: LAPAROSCOPIC BILATERAL SALPINGECTOMY;  Surgeon: Romualdo Bolk,  MD;  Location: [facility];  Service: Gynecology;  Laterality: N/A;  Beth RNFA   TUBAL LIGATION     WISDOM TOOTH EXTRACTION      Family History  Adopted: Yes  Problem Relation Age of Onset   Stroke Maternal Grandmother 45   Heart disease Maternal Grandmother     Allergies  Allergen Reactions   Latex Rash    Current Medications:   Current Outpatient Medications:    aspirin-acetaminophen-caffeine (EXCEDRIN MIGRAINE) 250-250-65 MG tablet, Take 2 tablets by mouth every 6 (six) hours as needed for headache., Disp: , Rfl:    Multiple Vitamin (MULTIVITAMIN) tablet, Take 1 tablet by mouth at bedtime. , Disp: , Rfl:    Review of Systems:   ROS Negative unless otherwise specified per HPI. Vitals:   There were no vitals filed for this visit.   There is no height or weight on file to calculate BMI.  Physical Exam:   Physical Exam Constitutional:      Appearance: Normal appearance. She is well-developed.  HENT:     Head: Normocephalic and atraumatic.  Eyes:     General: Lids are normal.  Extraocular Movements: Extraocular movements intact.     Conjunctiva/sclera: Conjunctivae normal.  Pulmonary:     Effort: Pulmonary effort is normal.  Musculoskeletal:        General: Normal range of motion.     Cervical back: Normal range of motion and neck supple.  Skin:    General: Skin is warm and dry.  Neurological:     Mental Status: She is alert and oriented to person, place, and time.  Psychiatric:        Attention and Perception: Attention and perception normal.        Mood and Affect: Mood normal.        Behavior: Behavior normal.        Thought Content: Thought content normal.        Judgment: Judgment normal.    Assessment and Plan:   Obesity, unspecified classification, unspecified obesity type, unspecified whether serious comorbidity present Ongoing Mounjaro sample provided -- start 2.5 mg weekly and asked patient to  send mychart message in 2-3 weeks to let us know if  you'd like to continue and if you'd like to move up to the next dose. Follow-up in 3 months  I,Havlyn C Ratchford,acting as a scribe for [employer], PA.,have documented all relevant documentation on the behalf of [name], PA,as directed by  [name], PA while in the presence of [name], [place].  I, [name], [place], have reviewed all documentation for this visit. The documentation on 04/12/21 for the exam, diagnosis, procedures, and orders are all accurate and complete.   [name], PA-C

## 2021-04-12 ENCOUNTER — Ambulatory Visit (INDEPENDENT_AMBULATORY_CARE_PROVIDER_SITE_OTHER)
Admission: RE | Admit: 2021-04-12 | Discharge: 2021-04-12 | Disposition: A | Discharge status: Home / Self Care | Payer: Self-pay | Source: Ambulatory Visit | Attending: Physician Assistant | Admitting: Physician Assistant

## 2021-04-12 ENCOUNTER — Other Ambulatory Visit: Payer: Self-pay

## 2021-04-12 ENCOUNTER — Ambulatory Visit (INDEPENDENT_AMBULATORY_CARE_PROVIDER_SITE_OTHER): Payer: 59 | Admitting: Physician Assistant

## 2021-04-12 ENCOUNTER — Encounter: Payer: Self-pay | Admitting: Physician Assistant

## 2021-04-12 VITALS — BP 130/80 | HR 102 | Temp 98.1°F | Ht 68.0 in | Wt 321.0 lb

## 2021-04-12 DIAGNOSIS — E669 Obesity, unspecified: Secondary | ICD-10-CM | POA: Diagnosis not present

## 2021-04-12 DIAGNOSIS — E785 Hyperlipidemia, unspecified: Secondary | ICD-10-CM

## 2021-04-12 MED ORDER — TIRZEPATIDE 2.5 MG/0.5ML ~~LOC~~ SOAJ: 2.5000 mg | SUBCUTANEOUS | 0 refills | Status: DC

## 2021-04-12 NOTE — Patient Instructions (Signed)
It was great to see you!  Please send mychart message in 2-3 weeks to let us know if you'd like to continue and if you'd like to move up to the next dose.  Let's follow-up in 3 months in the office, sooner if you have concerns.  Take care,  [name] PA-C

## 2021-04-12 NOTE — Addendum Note (Signed)
Addended by: Jimmye Norman on: 04/12/2021 01:14 PM   Modules accepted: Orders

## 2021-04-23 ENCOUNTER — Encounter: Payer: Self-pay | Admitting: Physician Assistant

## 2021-04-24 MED ORDER — TIRZEPATIDE 5 MG/0.5ML ~~LOC~~ SOAJ: 5.0000 mg | SUBCUTANEOUS | 0 refills | Status: DC

## 2021-04-30 MED ORDER — TIRZEPATIDE 5 MG/0.5ML ~~LOC~~ SOAJ: 5.0000 mg | SUBCUTANEOUS | 0 refills | Status: DC

## 2021-04-30 NOTE — Addendum Note (Signed)
Addended by: Jimmye Norman on: 04/30/2021 11:00 AM   Modules accepted: Orders

## 2021-05-13 ENCOUNTER — Ambulatory Visit: Payer: 59 | Admitting: Dietician

## 2021-05-27 ENCOUNTER — Other Ambulatory Visit: Payer: Self-pay | Admitting: Physician Assistant

## 2021-05-29 MED ORDER — TIRZEPATIDE 5 MG/0.5ML ~~LOC~~ SOAJ: 5.0000 mg | SUBCUTANEOUS | 0 refills | Status: DC

## 2021-05-29 NOTE — Addendum Note (Signed)
Addended by: Jimmye Norman on: 05/29/2021 12:59 PM   Modules accepted: Orders

## 2021-06-07 ENCOUNTER — Other Ambulatory Visit: Payer: Self-pay | Admitting: Physician Assistant

## 2021-06-07 MED ORDER — OZEMPIC (0.25 OR 0.5 MG/DOSE) 2 MG/1.5ML ~~LOC~~ SOPN: 0.5000 mg | PEN_INJECTOR | SUBCUTANEOUS | 1 refills | Status: DC

## 2021-06-17 ENCOUNTER — Other Ambulatory Visit: Payer: Self-pay

## 2021-06-17 ENCOUNTER — Encounter: Payer: 59 | Attending: Physician Assistant | Admitting: Dietician

## 2021-06-17 ENCOUNTER — Encounter: Payer: Self-pay | Admitting: Dietician

## 2021-06-17 DIAGNOSIS — E785 Hyperlipidemia, unspecified: Secondary | ICD-10-CM | POA: Diagnosis present

## 2021-06-17 DIAGNOSIS — E669 Obesity, unspecified: Secondary | ICD-10-CM | POA: Insufficient documentation

## 2021-06-17 NOTE — Progress Notes (Signed)
Medical Nutrition Therapy  Appointment Start time:  [phone]  Appointment End time:  0920 Patient is here today alone.  She was last seen by myself on 03/18/2021. She has lost 32 lbs in the past 3 months. She has been on [place] but insurance stopped covering this medication without a pre-authorization and she has been off of this for the past week.  She is concerned that she cannot maintain the weight loss without the medication. She states that the [facility] helped decrease her appetite and made her not desire sweets. She reports that sleep is about the same at about 6-7 hours per night yet has more energy. Now off diet soda and feels better. She has a raised desk with a walking path (treadmill) underneath this and is using this for 30 minutes twice per day while she works. She is resuming class next week but this does not cause her stress. Work has been more stressful and has thought about stress eating but was able to avoid this.  Primary concerns today:  She would like to lost weight.  She has tried low car diet (used to work and does not now).  She needs a consistent meal patter as she skips lunch. BM only 1-2 times per week. Referral diagnosis: obesity and HLD Preferred learning style: no preference indicated Learning readiness: ready, change in progress   NUTRITION ASSESSMENT   Anthropometrics  She reports stable weight for the past 2 years. She has tried low/no carb, decreased sugar, cutting portions in the past.  212 lbs lowest weight at age 36 327 lbs highest adult weight 2022 Gained weight when her and her ex husband separated She is an emotional eater.  Body Composition Scale 03/18/21 06/17/21  Current Body Weight 322.7 lbs 290.7 lbs  Total Body Fat % 48.2 45.4  Visceral Fat 17 14  Fat-Free Mass % 51.7 54.5   Total Body Water % 40.3 41.7  Muscle-Mass lbs 36.4 36.8  BMI 49.1 42.7  Body Fat Displacement           Torso  lbs 96.7 82         Left Leg  lbs 19.3 16.4          Right Leg  lbs 19.3 16.4         Left Arm  lbs 9.6 8.2         Right Arm   lbs 9.6 8.2     Clinical Medical Hx: HLD, obesity Medications: none Labs: 01/17/21:  Glucose 96, Cholesterol 179, HDL 41, LDL 113, Non HDL 138, Triglycerides 136, A1C 5.5% decreased from 5.8% 01/17/2020 Notable Signs/Symptoms: BMI, difficulty sleeping  Lifestyle & Dietary Hx Patient lives with her 13 and 59 yo children.  She does the shopping and cooking.  She works for [employer] from home.  She is working on her masters decree in [employer]. 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  Skipped lunch then as well. Appetite fair.  Not hungry for meals "too focused on what I am doing".  Denies binging.  Estimated daily fluid intake: >64 oz daily oz Supplements: MVI Sleep: 6-7 hours per nigh (she cannot sleep more and is very tired throughout the day) Stress / self-care: moderate Current average weekly physical activity: She is walking for 30 minutes twice per week (when her children has their sports practice).  24-Hr Dietary Recall First Meal: toast and 1/2 avocado OR protein  shake Snack: rare fruit Second Meal: meal preps - Grain bowl this week with brown rice, vegetables, black beans, chicken or steak Snack: none Third Meal: zucchini with pasta sauce, salad Snack: none Beverages: water, occasional unsweetened tea  NUTRITION DIAGNOSIS  NB-1.1 Food and nutrition-related knowledge deficit As related to balance of protein, carbohydrates, fat for weight loss.  As evidenced by patient report and diet hx.   NUTRITION INTERVENTION  Nutrition education (E-1) on the following topics:  Encouragement on changes made Comparison of body composition Emotional eating Behavior change Medication results and how to continue off medication for weight loss  Handouts Provided Include  My Plate Placemat Meal plan card  Learning Style &  Readiness for Change Teaching method utilized: Visual & Auditory  Demonstrated degree of understanding via: Teach Back  Barriers to learning/adherence to lifestyle change: time  After visit summary: Great job on changes made!  Being more active  Drinking water rather than other choices including diet drinks  Mindful eating  Other alternative rather than eating when stressed  Meal prep  If hungry, consider low calorie density foods such as non-starchy vegetables.  Snack ideas if hungry:  Peanut butter (1T) on celery  Hummus and raw vegetables  Fruit  Goals: Continue to stay active Mindful eating Consistent meals without skipping   MONITORING & EVALUATION Dietary intake, weekly physical activity, and weight in 2 months.  Next Steps  Patient is to call for questions.

## 2021-06-17 NOTE — Patient Instructions (Signed)
Great job on changes made!  Being more active  Drinking water rather than other choices including diet drinks  Mindful eating  Other alternative rather than eating when stressed  Meal prep  If hungry, consider low calorie density foods such as non-starchy vegetables.  Snack ideas if hungry:  Peanut butter (1T) on celery  Hummus and raw vegetables  Fruit

## 2021-06-21 ENCOUNTER — Telehealth: Payer: Self-pay | Admitting: *Deleted

## 2021-06-21 ENCOUNTER — Other Ambulatory Visit: Payer: Self-pay

## 2021-06-21 MED ORDER — OZEMPIC (0.25 OR 0.5 MG/DOSE) 2 MG/1.5ML ~~LOC~~ SOPN: 0.2500 mg | PEN_INJECTOR | SUBCUTANEOUS | 1 refills | Status: DC

## 2021-06-21 NOTE — Telephone Encounter (Signed)
Received fax from [facility] needed for Ozempic.

## 2021-06-21 NOTE — Telephone Encounter (Signed)
Rx sent 

## 2021-06-24 ENCOUNTER — Encounter: Payer: Self-pay | Admitting: Physician Assistant

## 2021-06-24 ENCOUNTER — Ambulatory Visit (INDEPENDENT_AMBULATORY_CARE_PROVIDER_SITE_OTHER): Payer: 59 | Admitting: Physician Assistant

## 2021-06-24 ENCOUNTER — Other Ambulatory Visit: Payer: Self-pay

## 2021-06-24 VITALS — BP 110/80 | HR 82 | Temp 97.6°F | Ht 68.0 in | Wt 291.2 lb

## 2021-06-24 DIAGNOSIS — E669 Obesity, unspecified: Secondary | ICD-10-CM | POA: Diagnosis not present

## 2021-06-24 MED ORDER — WEGOVY 0.25 MG/0.5ML ~~LOC~~ SOAJ: 0.2500 mg | SUBCUTANEOUS | 0 refills | Status: DC

## 2021-06-24 NOTE — Progress Notes (Signed)
Amanda Davis is a 36 y.o. female here for a follow up of obesity.  History of Present Illness:   Chief Complaint  Patient presents with   Obesity    HPI  Obesity Following our previous visit on 04/12/21, Amanda Davis expressed she wanted to trial mounjaro. After doing well on 2.5 mg dosage, she shortly increased to 5 mg. Although she was doing well at this dosage, she had to discontinue use of the medication due to it becoming unaffordable. At this time she has used her last mounjaro injection 3 weeks ago, and has managed to lose 34 pounds since beginning the medication. Currently she is trying to find a medication that is covered by her insurance.   Past Medical History:  Diagnosis Date   Abnormal Pap smear of cervix ~2005   colpo biopsy   Allergy    Latex allergy   Anxiety    Depression    Hemorrhage after delivery of fetus 2011   Hyperlipidemia 03/12/2021   SVD (spontaneous vaginal delivery)    x 2     Social History   Tobacco Use   Smoking status: Former    Packs/day: 0.25    Years: 7.00    Pack years: 1.75    Types: Cigarettes    Quit date: 07/26/2014    Years since quitting: 6.9   Smokeless tobacco: Never  Vaping Use   Vaping Use: Never used  Substance Use Topics   Alcohol use: Yes    Alcohol/week: 1.0 - 2.0 standard drink    Types: 1 - 2 Glasses of wine per week   Drug use: No    Past Surgical History:  Procedure Laterality Date   APPENDECTOMY  2003   BREAST SURGERY Bilateral 11/14/2002   Breast Reduction    COLPOSCOPY  ~2005   Normal - per pt   KNEE SURGERY     left   LAPAROSCOPIC BILATERAL SALPINGECTOMY N/A 02/28/2016   Procedure: LAPAROSCOPIC BILATERAL SALPINGECTOMY;  Surgeon: Romualdo Bolk, MD;  Location: [facility];  Service: Gynecology;  Laterality: N/A;  Beth RNFA   TUBAL LIGATION     WISDOM TOOTH EXTRACTION      Family History  Adopted: Yes  Problem Relation Age of Onset   Stroke Maternal Grandmother 45   Heart disease Maternal  Grandmother     Allergies  Allergen Reactions   Latex Rash    Current Medications:   Current Outpatient Medications:    aspirin-acetaminophen-caffeine (EXCEDRIN MIGRAINE) 250-250-65 MG tablet, Take 2 tablets by mouth every 6 (six) hours as needed for headache., Disp: , Rfl:    Multiple Vitamin (MULTIVITAMIN) tablet, Take 1 tablet by mouth at bedtime. , Disp: , Rfl:    Semaglutide,0.25 or 0.5MG /DOS, (OZEMPIC, 0.25 OR 0.5 MG/DOSE,) 2 MG/1.5ML SOPN, Inject 0.25 mg into the skin once a week. (Patient not taking: Reported on 06/24/2021), Disp: 1.5 mL, Rfl: 1   Review of Systems:   ROS Negative unless otherwise specified per HPI. Vitals:   Vitals:   06/24/21 1058  BP: 110/80  Pulse: 82  Temp: 97.6 F (36.4 C)  TempSrc: Temporal  SpO2: 96%  Weight: 291 lb 4 oz (132.1 kg)  Height: 5\' 8"  (1.727 m)     Body mass index is 44.28 kg/m.  Physical Exam:   Physical Exam Vitals and nursing note reviewed.  Constitutional:      General: She is not in acute distress.    Appearance: She is well-developed. She is not ill-appearing or toxic-appearing.  Cardiovascular:     Rate and Rhythm: Normal rate and regular rhythm.     Pulses: Normal pulses.     Heart sounds: Normal heart sounds, S1 normal and S2 normal.  Pulmonary:     Effort: Pulmonary effort is normal.     Breath sounds: Normal breath sounds.  Skin:    General: Skin is warm and dry.  Neurological:     Mental Status: She is alert.     GCS: GCS eye subscore is 4. GCS verbal subscore is 5. GCS motor subscore is 6.  Psychiatric:        Speech: Speech normal.        Behavior: Behavior normal. Behavior is cooperative.    Assessment and Plan:   Obesity, unspecified classification, unspecified obesity type, unspecified whether serious comorbidity present Improving We are continuing to try to find a medication that her insurance will approve Start Wegovy 2.5 mg weekly injection x 4 weeks, provided sample The plan is to  hopefully get Ozempic covered by her insurance so she can maintain this medication Encouraged patient to continue making healthier dietary choices and participating in regular exercise  Follow up as needed  I,Havlyn C Ratchford,acting as a scribe for [name], PA.,have documented all relevant documentation on the behalf of [name], PA,as directed by  [name], PA while in the presence of [name], [place].  I, [name], [place], have reviewed all documentation for this visit. The documentation on 06/24/21 for the exam, diagnosis, procedures, and orders are all accurate and complete.   [name], PA-C

## 2021-06-25 NOTE — Telephone Encounter (Signed)
[id number]. OZEMPIC INJ 2/1.5ML is denied for not meeting the prior authorization requirement

## 2021-06-25 NOTE — Telephone Encounter (Signed)
(  Key: [id number]) Ozempic (0.25 or 0.5 MG/DOSE) 2MG /1.5ML pen-injectors Waiting for determination

## 2021-07-01 NOTE — Telephone Encounter (Signed)
Spoke to pt told her Ozempic has been denied by insurance due to not being diabetic. Pt verbalized understanding and said she received a letter from insurance. Told pt she can contact her insurance to see what is covered for weight loss like Wegovy, Mounjaro or Phentermine, but a lot of them will not be covered due to not diabetic. Please let us know. Pt verbalized understanding.

## 2021-08-12 ENCOUNTER — Other Ambulatory Visit: Payer: Self-pay

## 2021-08-12 ENCOUNTER — Encounter: Payer: 59 | Attending: Physician Assistant | Admitting: Dietician

## 2021-08-12 ENCOUNTER — Encounter: Payer: Self-pay | Admitting: Dietician

## 2021-08-12 DIAGNOSIS — Z6841 Body Mass Index (BMI) 40.0 and over, adult: Secondary | ICD-10-CM

## 2021-08-12 NOTE — Progress Notes (Signed)
Medical Nutrition Therapy  ?Appointment Start time:  [phone]  Appointment End time:  1625 ?Patient is here today alone.  She was last seen by myself on 06/17/2021 ? ?Patient has continue to lose weight. ?She states that overall she feels better but her sleep is poor again.  She states that she slept best when on Monjaro and Ozempic but insurance did not cover even with a prior authorization.   ?Sleep is currently 6-7 hours decreased from 8 hours and was sleeping 5 hours per night prior to this.  ?She has increased her exercise. ?Boyfriend was diagnosed with high cholesterol and she has gotten more strict with their eating (limiting red meat and whole grain choices) ?Continues to meal prep ? ?She has lost 42 lbs in the past 5 months. ?Now off diet soda and feels better. ?She is resumed class.  Son trying out for baseball team.   ?Work has been more stressful and has thought about stress eating but was able to avoid this. ? ?Primary concerns today:  She would like to lost weight.  She has tried low car diet (used to work and does not now).  She needs a consistent meal patter as she skips lunch. ?BM only 1-2 times per week. ?Referral diagnosis: obesity and HLD ?Preferred learning style: no preference indicated ?Learning readiness: ready, change in progress ? ? ?NUTRITION ASSESSMENT  ? ?Anthropometrics  ?She reports stable weight for the past 2 years. ?She has tried low/no carb, decreased sugar, cutting portions in the past. ? ?212 lbs lowest weight at age 52 ?327 lbs highest adult weight 2022 ?Gained weight when her and her ex husband separated ?She is an Surveyor, quantity. ? ?Body Composition Scale 03/18/21 06/17/21 3/6/ ?2023  ?Current Body Weight 322.7 lbs 290.7 lbs 279.9  ?Total Body Fat % 48.2 45.4 44.4  ?Visceral Fat 17 14 13   ?Fat-Free Mass % 51.7 54.5 55.5  ? Total Body Water % 40.3 41.7 42.2  ?Muscle-Mass lbs 36.4 36.8 36.7  ?BMI 49.1 42.7 41  ?Body Fat Displacement     ?       Torso  lbs 96.7 82 77.1  ?       Left Leg   lbs 19.3 16.4 15.4  ?       Right Leg  lbs 19.3 16.4 15.4  ?       Left Arm  lbs 9.6 8.2 7.7  ?       Right Arm   lbs 9.6 8.2 7.7  ? ? ? ?Clinical ?Medical Hx: HLD, obesity ?Medications: none ?Labs: 01/17/21:  Glucose 96, Cholesterol 179, HDL 41, LDL 113, Non HDL 138, Triglycerides 136, A1C 5.5% decreased from 5.8% 01/17/2020 ?Notable Signs/Symptoms: BMI, difficulty sleeping ? ?Lifestyle & Dietary Hx ?Patient lives with her 40 and 27 yo children.  She does the shopping and cooking.  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  She is working on her masters decree in [employer]. 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  Skipped lunch then as well. ?Appetite fair.  Not hungry for meals "too focused on what I am doing".  Denies binging. ? ?Estimated daily fluid intake: >64 oz daily oz ?Supplements: MVI ?Sleep: 6-7 hours per nigh (she cannot sleep more and is very tired throughout the day) ?Stress / self-care: moderate ?Current average weekly physical activity: She is walking for 30 minutes 2 times per week (when her children has  their sports practice).  She also, now has a walking path and self rising desk and walks 4 times per week for 30 minutes. ? ?24-Hr Dietary Recall ?First Meal: toast and 1/2 avocado OR protein shake OR overnight oats ?Snack: rare fruit ?Second Meal: meal preps - Grain bowl this week with brown rice, vegetables, black beans, chicken or steak ?Snack: none ?Third Meal: zucchini with pasta sauce with ground [place], salad ?Snack: none ?Beverages: water, occasional unsweetened tea ? ?NUTRITION DIAGNOSIS  ?NB-1.1 Food and nutrition-related knowledge deficit As related to balance of protein, carbohydrates, fat for weight loss.  As evidenced by patient report and diet hx. ? ? ?NUTRITION INTERVENTION  ?Nutrition education (E-1) on the following topics:   ?Encouragement on changes made ?Comparison of body composition ?Discussion of  exercise ?Behavior change ? ?Handouts Provided Include (initial visit) ?My Plate Placemat ?Meal plan card ? ?Learning Style & Readiness for Change ?Teaching method utilized: Visual & Auditory  ?Demonstrated degree of understanding via: Teach Back  ?Barriers to learning/adherence to lifestyle change: time ? ?After visit summary: ?Great job on changes made! ? Being more active ? Drinking water rather than other choices including diet drinks ? Mindful eating ? Other alternative rather than eating when stressed ? Meal prep ? ?If hungry, consider low calorie density foods such as non-starchy vegetables. ? ?Goals: ?Consider magnesium supplement before bed to see if this helps with sleep. ? ?Continue staying active. ?Continue your meal prepping. ?Great job on the changes you are making! ? ? ?MONITORING & EVALUATION ?Dietary intake, weekly physical activity, and weight in 2 months. ? ?Next Steps  ?Patient is to call for questions. ? ?

## 2021-08-12 NOTE — Patient Instructions (Addendum)
Consider magnesium supplement before bed to see if this helps with sleep. ? ?Continue staying active. ?Continue your meal prepping. ?Great job on the changes you are making! ? ?

## 2021-09-23 ENCOUNTER — Ambulatory Visit (INDEPENDENT_AMBULATORY_CARE_PROVIDER_SITE_OTHER): Payer: 59 | Admitting: Physician Assistant

## 2021-09-23 ENCOUNTER — Encounter: Payer: Self-pay | Admitting: Physician Assistant

## 2021-09-23 VITALS — BP 119/68 | HR 75 | Temp 97.9°F | Ht 68.0 in | Wt 287.0 lb

## 2021-09-23 DIAGNOSIS — Z0000 Encounter for general adult medical examination without abnormal findings: Secondary | ICD-10-CM | POA: Diagnosis not present

## 2021-09-23 DIAGNOSIS — E669 Obesity, unspecified: Secondary | ICD-10-CM

## 2021-09-23 DIAGNOSIS — F32A Depression, unspecified: Secondary | ICD-10-CM

## 2021-09-23 DIAGNOSIS — E785 Hyperlipidemia, unspecified: Secondary | ICD-10-CM | POA: Diagnosis not present

## 2021-09-23 DIAGNOSIS — F419 Anxiety disorder, unspecified: Secondary | ICD-10-CM

## 2021-09-23 MED ORDER — PHENTERMINE HCL 37.5 MG PO TABS: 37.5000 mg | ORAL_TABLET | Freq: Every day | ORAL | 2 refills | Status: DC

## 2021-09-23 NOTE — Progress Notes (Signed)
? ?Subjective:  ?  ?Amanda MottsJuliane E Davis is a 36 y.o. female and is here for a comprehensive physical exam. ? ?HPI ? ?Health Maintenance Due  ?Topic Date Due  ? Hepatitis C Screening  Never done  ? ? ?Acute Concerns: ?None ? ?Chronic Issues: ?Obesity  ?Following our previous visit on 06/24/21, pt trialed the [facility] mg weekly injection provided for 4 weeks. Although she found it beneficial and was still losing weight on it, she wasn't able to continue this due to insurance not covering this. On 08/12/21, she visited Oran ReinLaura Jobe, RD where she reported she had still be experiencing weight loss, especially following her boyfriend's dx of high cholesterol. She had become more strict with her diet and even cut out diet soda and resisted stress eating. While this was the case at this visit, she is now seeing that her weight is slowly increasing despite not changing anything in her diet. Pt does admit that she hasn't been in taking as much water, but doesn't believe that could be contributing to this. At this point she is interested in trialing a different medication in hopes that her insurance will cover this and she will be able to see the results of her hard work.  ? ? ?Anxiety; Insomnia ?Since stopping the mounjaro and ozempic, Amanda Davis has found that she is not sleeping as well. Upon further discussion, she explains belief that her sleep is being affected due anxious thoughts. Reports that with everything going on in life with her kid's activities, work, school, and dealing with her ex husband, she is unable to shut off her mind. Pt has previously trialed medications when younger but has experienced SI with some of these. She has brought this up to her nutritionist who recommended taking magnesium nightly for improvement of sleep. While she is not finding any relief with the 250 mg dosage, she is hoping this will change when she increases the dose. but this has provided much relief.This alone makes her hesitant to  trial anything else. Denies SI/HI.  ? ? ?Health Maintenance: ?Immunizations -- Covid- UTD ?Influenza- UTD;2022 ?Tdap- UTD;2021 ?PAP -- Due;2019 ?Bone Density -- N/A ?Diet -- Eats all food groups; limited red meat and cut out sodas ?Sleep habits -- See above ?Exercise -- Cardio as able; treadmill and walking around soccer field  ?Current Weight -- Stable ?Weight History: ?Wt Readings from Last 10 Encounters:  ?09/23/21 287 lb (130.2 kg)  ?06/24/21 291 lb 4 oz (132.1 kg)  ?04/12/21 (!) 321 lb (145.6 kg)  ?03/18/21 (!) 322 lb 11.2 oz (146.4 kg)  ?03/12/21 (!) 321 lb (145.6 kg)  ?01/17/21 (!) 323 lb (146.5 kg)  ?01/17/20 (!) 323 lb (146.5 kg)  ?11/02/19 (!) 324 lb (147 kg)  ?08/01/19 (!) 319 lb (144.7 kg)  ?01/05/19 (!) 301 lb (136.5 kg)  ? ?Body mass index is 43.64 kg/m?. ?Mood -- See above ? ?Patient's last menstrual period was 08/24/2021 (approximate). ?Period characteristics -- Normal ?Birth control -- None reported.  ? ? ? reports current alcohol use of about 1.0 - 2.0 standard drink per week.  ?Tobacco Use: Medium Risk  ? Smoking Tobacco Use: Former  ? Smokeless Tobacco Use: Never  ? Passive Exposure: Not on file  ? ? ? ? ?  09/23/2021  ?  3:25 PM  ?Depression screen PHQ 2/9  ?Decreased Interest 0  ?Down, Depressed, Hopeless 0  ?PHQ - 2 Score 0  ? ? ? ?Other providers/specialists: ?Patient Care Team: ?Jarold MottoWorley, Veida Spira, [place] as PCP -  General (Advice worker) ?Romualdo Bolk, MD as Consulting Physician (Obstetrics and Gynecology)  ? ?PMHx, SurgHx, SocialHx, Medications, and Allergies were reviewed in the Visit Navigator and updated as appropriate.  ? ?Past Medical History:  ?Diagnosis Date  ? Abnormal Pap smear of cervix ~2005  ? colpo biopsy  ? Allergy   ? Latex allergy  ? Anxiety   ? Depression   ? Hemorrhage after delivery of fetus 2011  ? Hyperlipidemia 03/12/2021  ? SVD (spontaneous vaginal delivery)   ? x 2  ? ? ? ?Past Surgical History:  ?Procedure Laterality Date  ? APPENDECTOMY  2003  ? BREAST  SURGERY Bilateral 11/14/2002  ? Breast Reduction   ? COLPOSCOPY  ~2005  ? Normal - per pt  ? KNEE SURGERY    ? left  ? LAPAROSCOPIC BILATERAL SALPINGECTOMY N/A 02/28/2016  ? Procedure: LAPAROSCOPIC BILATERAL SALPINGECTOMY;  Surgeon: Romualdo Bolk, MD;  Location: [facility];  Service: Gynecology;  Laterality: N/A;  Beth RNFA  ? TUBAL LIGATION    ? WISDOM TOOTH EXTRACTION    ? ? ? ?Family History  ?Adopted: Yes  ?Problem Relation Age of Onset  ? Stroke Maternal Grandmother 45  ? Heart disease Maternal Grandmother   ? ? ?Social History  ? ?Tobacco Use  ? Smoking status: Former  ?  Packs/day: 0.25  ?  Years: 7.00  ?  Pack years: 1.75  ?  Types: Cigarettes  ?  Quit date: 07/26/2014  ?  Years since quitting: 7.1  ? Smokeless tobacco: Never  ?Vaping Use  ? Vaping Use: Never used  ?Substance Use Topics  ? Alcohol use: Yes  ?  Alcohol/week: 1.0 - 2.0 standard drink  ?  Types: 1 - 2 Glasses of wine per week  ? Drug use: No  ? ? ?Review of Systems:  ? ?Review of Systems  ?Constitutional:  Negative for chills, fever, malaise/fatigue and weight loss.  ?HENT:  Negative for hearing loss, sinus pain and sore throat.   ?Respiratory:  Negative for cough and hemoptysis.   ?Cardiovascular:  Negative for chest pain, palpitations, leg swelling and PND.  ?Gastrointestinal:  Negative for abdominal pain, constipation, diarrhea, heartburn, nausea and vomiting.  ?Genitourinary:  Negative for dysuria, frequency and urgency.  ?Musculoskeletal:  Negative for back pain, myalgias and neck pain.  ?Skin:  Negative for itching and rash.  ?Neurological:  Negative for dizziness, tingling, seizures and headaches.  ?Endo/Heme/Allergies:  Negative for polydipsia.  ?Psychiatric/Behavioral:  Negative for depression. The patient is not nervous/anxious.   ? ?Objective:  ? ?BP 119/68 (BP Location: Left Arm)   Pulse 75   Temp 97.9 ?F (36.6 ?C) (Temporal)   Ht 5\' 8"  (1.727 m)   Wt 287 lb (130.2 kg)   LMP 08/24/2021 (Approximate)   SpO2 98%   BMI 43.64  kg/m?  ? ?General Appearance:    Alert, cooperative, no distress, appears stated age  ?Head:    Normocephalic, without obvious abnormality, atraumatic  ?Eyes:    PERRL, conjunctiva/corneas clear, EOM's intact, fundi  ?  benign, both eyes  ?Ears:    Normal TM's and external ear canals, both ears  ?Nose:   Nares normal, septum midline, mucosa normal, no drainage    or sinus tenderness  ?Throat:   Lips, mucosa, and tongue normal; teeth and gums normal  ?Neck:   Supple, symmetrical, trachea midline, no adenopathy;  ?  thyroid:  no enlargement/tenderness/nodules; no carotid ?  bruit or JVD  ?Back:  Symmetric, no curvature, ROM normal, no CVA tenderness  ?Lungs:     Clear to auscultation bilaterally, respirations unlabored  ?Chest Wall:    No tenderness or deformity  ? Heart:    Regular rate and rhythm, S1 and S2 normal, no murmur, rub   or gallop  ?Breast Exam:    Deferred  ?Abdomen:     Deferred  ?Genitalia:    Deferred  ?Rectal:    Deferred  ?Extremities:   Extremities normal, atraumatic, no cyanosis or edema  ?Pulses:   2+ and symmetric all extremities  ?Skin:   Skin color, texture, turgor normal, no rashes or lesions  ?Lymph nodes:   Cervical, supraclavicular, and axillary nodes normal  ?Neurologic:   CNII-XII intact, normal strength, sensation and reflexes  ?  throughout  ? ? ?Assessment/Plan:  ? ?Routine physical examination ?Today patient counseled on age appropriate routine health concerns for screening and prevention, each reviewed and up to date or declined. Immunizations reviewed and up to date or declined. Labs ordered and reviewed. Risk factors for depression reviewed and negative. Hearing function and visual acuity are intact. ADLs screened and addressed as needed. Functional ability and level of safety reviewed and appropriate. Education, counseling and referrals performed based on assessed risks today. Patient provided with a copy of personalized plan for preventive services. ? ?Obesity, unspecified  classification, unspecified obesity type,  ?Start 1/2 tablet of phentermine daily x 2 weeks ?May increase dosage after 2 weeks if comfortable enough to do so ?Risks/benefits of medication discussed and patient is agree

## 2021-09-23 NOTE — Patient Instructions (Signed)
It was great to see you! ? ?Start 1/2 tablet of phentermine daily x 2 weeks, may increase after that ? ?Let's follow-up in 3 months, sooner if you have concerns. ? ?Take care, ? ?[name] PA-C  ?

## 2021-11-11 ENCOUNTER — Encounter: Payer: 59 | Attending: Physician Assistant | Admitting: Dietician

## 2021-11-11 ENCOUNTER — Encounter: Payer: Self-pay | Admitting: Dietician

## 2021-11-11 DIAGNOSIS — Z6841 Body Mass Index (BMI) 40.0 and over, adult: Secondary | ICD-10-CM | POA: Insufficient documentation

## 2021-11-11 DIAGNOSIS — E785 Hyperlipidemia, unspecified: Secondary | ICD-10-CM | POA: Diagnosis present

## 2021-11-11 NOTE — Progress Notes (Signed)
Medical Nutrition Therapy  Appointment Start time:  [phone]  Appointment End time:  0830 Patient is here today alone.  She was last seen by myself on 06/17/2021  Weight currently at a plateau.   More busy than usual with son's baseball/sports schedule. She has continued to walk as she can do this while working. She continues to limit red meat to rare occasions. She stopped tracking her food intake as life got very busy. Decreased water intake and goal to increase this. Started magnesium before bed and sleep has improved. Overall feels good with good energy. Stopped phentermine due to very dry mouth.  She has lost 42 lbs in the past 5 months. Now off diet soda and feels better. She is resumed class.  Son trying out for baseball team.   Work has been more stressful and has thought about stress eating but was able to avoid this.  Primary concerns today:  She would like to lost weight.  She has tried low car diet (used to work and does not now).  She needs a consistent meal patter as she skips lunch. BM only 1-2 times per week. Referral diagnosis: obesity and HLD Preferred learning style: no preference indicated Learning readiness: ready, change in progress   NUTRITION ASSESSMENT   Anthropometrics  She reports stable weight for the past 2 years. She has tried low/no carb, decreased sugar, cutting portions in the past.  212 lbs lowest weight at age 36 327 lbs highest adult weight 2022 Gained weight when her and her ex husband separated She is an emotional eater.  Body Composition Scale 03/18/21 06/17/21 3/6/ 2023 11/11/2021  Current Body Weight 322.7 lbs 38 lbs 279.9 280.8  Total Body Fat % 48.2 45.4 44.4 44.6  Visceral Fat 17 14 13 13   Fat-Free Mass % 51.7 54.5 55.5 55.3   Total Body Water % 40.3 41.7 42.2 42.1  Muscle-Mass lbs 36.4 36.8 36.7 36.7  BMI 49.1 42.7 41 41.3  Body Fat Displacement             Torso  lbs 96.7 82 77.1 77.7         Left Leg  lbs 19.3 16.4 15.4 15.5          Right Leg  lbs 19.3 16.4 15.4 15.5         Left Arm  lbs 9.6 8.2 7.7 7.7         Right Arm   lbs 9.6 8.2 7.7 7.7     Clinical Medical Hx: HLD, obesity Medications: none Labs: 01/17/21:  Glucose 96, Cholesterol 179, HDL 41, LDL 113, Non HDL 138, Triglycerides 136, A1C 5.5% decreased from 5.8% 01/17/2020 Notable Signs/Symptoms: BMI, difficulty sleeping  Lifestyle & Dietary Hx Patient lives with her 17 and 22 yo children.  She does the shopping and cooking.  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  She is working on her masters decree in [employer]. 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  Skipped lunch then as well. Appetite fair.  Not hungry for meals "too focused on what I am doing".  Denies binging.  Estimated daily fluid intake: >64 oz daily oz Supplements: MVI Sleep: 6-7 hours per nigh (she cannot sleep more and is very tired throughout the day) Stress / self-care: moderate Current average weekly physical activity: She is walking for 30 minutes 2 times per week (when her children has their sports practice).  She also, now has a walking path and self rising desk and walks 4 times per week for 30 minutes.  24-Hr Dietary Recall First Meal: yogurt and granola OR egg white bites and rye toast Snack: fruit Second Meal: naan with hummus OR lunch meat with lettuce OR tuna and triscuits Snack: none Third Meal:  air fried chicken or other lean meat, vegetable, salad Snack: none Beverages: water, occasional unsweetened tea, rare regular sprite  NUTRITION DIAGNOSIS  NB-1.1 Food and nutrition-related knowledge deficit As related to balance of protein, carbohydrates, fat for weight loss.  As evidenced by patient report and diet hx.   NUTRITION INTERVENTION  Nutrition education (E-1) on the following topics:   Encouragement on changes made Comparison of body composition Discussion of exercise Behavior  change Fluid intake  Handouts Provided Include (initial visit) My Plate Placemat Meal plan card  Learning Style & Readiness for Change Teaching method utilized: Visual & Auditory  Demonstrated degree of understanding via: Teach Back  Barriers to learning/adherence to lifestyle change: time  After visit summary: Be sure to drink adequate water. Add vegetable to lunch Continue to walk  [place] job on continued consistency overall!  Goals: Consider magnesium supplement before bed to see if this helps with sleep.  Continue staying active. Continue your meal prepping. Great job on the changes you are making!   MONITORING & EVALUATION Dietary intake, weekly physical activity, and weight in 2 months.  Next Steps  Patient is to call for questions.

## 2021-11-11 NOTE — Patient Instructions (Signed)
Be sure to drink adequate water. Add vegetable to lunch Continue to walk  [place] job on continued consistency overall!

## 2021-12-23 ENCOUNTER — Encounter: Payer: Self-pay | Admitting: Physician Assistant

## 2021-12-23 ENCOUNTER — Ambulatory Visit (INDEPENDENT_AMBULATORY_CARE_PROVIDER_SITE_OTHER): Payer: 59 | Admitting: Physician Assistant

## 2021-12-23 VITALS — BP 120/80 | HR 64 | Temp 98.0°F | Ht 68.0 in | Wt 288.2 lb

## 2021-12-23 DIAGNOSIS — F419 Anxiety disorder, unspecified: Secondary | ICD-10-CM

## 2021-12-23 DIAGNOSIS — F32A Depression, unspecified: Secondary | ICD-10-CM | POA: Diagnosis not present

## 2021-12-23 DIAGNOSIS — Z6841 Body Mass Index (BMI) 40.0 and over, adult: Secondary | ICD-10-CM

## 2021-12-23 MED ORDER — PHENTERMINE HCL 37.5 MG PO TABS: 37.5000 mg | ORAL_TABLET | Freq: Every day | ORAL | 2 refills | Status: DC

## 2021-12-23 MED ORDER — ALPRAZOLAM 0.25 MG PO TABS: 0.2500 mg | ORAL_TABLET | Freq: Two times a day (BID) | ORAL | 1 refills | Status: DC | PRN

## 2021-12-23 NOTE — Patient Instructions (Signed)
See attached information re: Contrave for weight loss and let me know your thoughts on this.

## 2021-12-23 NOTE — Progress Notes (Signed)
Amanda Davis is a 36 y.o. female here for a follow up on obesity.   History of Present Illness:   Chief Complaint  Patient presents with   Obesity   Anxiety    HPI  Obesity  3 months follow-up. Patient is currently compliant with taking Phentermine 37.5 mg daily with no complications. States she has noticed some mouth dryness since starting this medication. Associated with pain as well. States she has stopped taking Phentermine for a month. However, she is currently compliant with medication. Has been trying to adjust her lifestyle by making healthier diet choices and getting regular exercise. Denies any other adverse side effects. At this time, she would like to continue with this regimen since other medication are not covered by her insurance. Denies any other concerns.   Anxiety  Patient is currently not taking any medication for this issue. States she has had increased anxiety due to her daughter. She has been recently dealing with her ex husband. She was taking Xanax in the past and this has helped. At this time, she is interested to trial medication for her symptoms. Denies any other worsening sx. Denis SI/HI.   Past Medical History:  Diagnosis Date   Abnormal Pap smear of cervix ~2005   colpo biopsy   Allergy    Latex allergy   Anxiety    Depression    Hemorrhage after delivery of fetus 2011   Hyperlipidemia 03/12/2021   SVD (spontaneous vaginal delivery)    x 2     Social History   Tobacco Use   Smoking status: Former    Packs/day: 0.25    Years: 7.00    Total pack years: 1.75    Types: Cigarettes    Quit date: 07/26/2014    Years since quitting: 7.4   Smokeless tobacco: Never  Vaping Use   Vaping Use: Never used  Substance Use Topics   Alcohol use: Yes    Alcohol/week: 1.0 - 2.0 standard drink of alcohol    Types: 1 - 2 Glasses of wine per week   Drug use: No    Past Surgical History:  Procedure Laterality Date   APPENDECTOMY  2003   BREAST  SURGERY Bilateral 11/14/2002   Breast Reduction    COLPOSCOPY  ~2005   Normal - per pt   KNEE SURGERY     left   LAPAROSCOPIC BILATERAL SALPINGECTOMY N/A 02/28/2016   Procedure: LAPAROSCOPIC BILATERAL SALPINGECTOMY;  Surgeon: Romualdo Bolk, MD;  Location: [facility];  Service: Gynecology;  Laterality: N/A;  Beth RNFA   TUBAL LIGATION     WISDOM TOOTH EXTRACTION      Family History  Adopted: Yes  Problem Relation Age of Onset   Stroke Maternal Grandmother    Heart disease Maternal Grandmother     Allergies  Allergen Reactions   Latex Rash    Current Medications:   Current Outpatient Medications:    aspirin-acetaminophen-caffeine (EXCEDRIN MIGRAINE) 250-250-65 MG tablet, Take 2 tablets by mouth every 6 (six) hours as needed for headache., Disp: , Rfl:    magnesium (MAGTAB) 84 MG ( ) TBCR SR tablet, Take 250 mg by mouth., Disp: , Rfl:    Multiple Vitamin (MULTIVITAMIN) tablet, Take 1 tablet by mouth at bedtime. , Disp: , Rfl:    phentermine (ADIPEX-P) 37.5 MG tablet, Take 1 tablet (37.5 mg total) by mouth daily before breakfast., Disp: 30 tablet, Rfl: 2   vitamin B-12 (CYANOCOBALAMIN) 500 MCG tablet, Take 500 mcg by mouth daily., Disp: ,  Rfl:    Review of Systems:   ROS Negative unless otherwise specified per HPI.   Vitals:   Vitals:   12/23/21 1511  BP: 120/80  Pulse: 64  Temp: 98 F (36.7 C)  TempSrc: Temporal  SpO2: 98%  Weight: 288 lb 4 oz (130.7 kg)  Height: 5\' 8"  (1.727 m)     Body mass index is 43.83 kg/m.  Physical Exam:   Physical Exam Vitals and nursing note reviewed.  Constitutional:      General: She is not in acute distress.    Appearance: She is well-developed. She is not ill-appearing or toxic-appearing.  Cardiovascular:     Rate and Rhythm: Normal rate and regular rhythm.     Pulses: Normal pulses.     Heart sounds: Normal heart sounds, S1 normal and S2 normal.  Pulmonary:     Effort: Pulmonary effort is normal.     Breath sounds:  Normal breath sounds.  Skin:    General: Skin is warm and dry.  Neurological:     Mental Status: She is alert.     GCS: GCS eye subscore is 4. GCS verbal subscore is 5. GCS motor subscore is 6.  Psychiatric:        Speech: Speech normal.        Behavior: Behavior normal. Behavior is cooperative.     Assessment and Plan:   Anxiety and depression Uncontrolled Declines daily medication at this time; denies SI/HI Will trial xanax 0.25 mg prn -- this has worked well for her in the past Side effects, risks discussed Follow-up if 3 months  Class 3 severe obesity with body mass index (BMI) of 40.0 to 44.9 in adult, unspecified obesity type, unspecified whether serious comorbidity present (HCC) Uncontrolled Continue phentermine Consider contrave -- handout provided Follow-up in 3 months  I,Savera Zaman,acting as a scribe for , PA.,have documented all relevant documentation on the behalf of [employer], PA,as directed by  [name], PA while in the presence of [name], [name].   I, [place], [name], have reviewed all documentation for this visit. The documentation on 12/23/21 for the exam, diagnosis, procedures, and orders are all accurate and complete.  12/25/21, PA-C

## 2021-12-31 ENCOUNTER — Telehealth: Payer: Self-pay | Admitting: *Deleted

## 2021-12-31 NOTE — Telephone Encounter (Signed)
Prior Authorization for Phentermine 37.5 mg has been denied by insurance. [name] notified, she said pt can use Good Rx to continue medication.

## 2021-12-31 NOTE — Telephone Encounter (Signed)
Left message on voicemail to call office.  

## 2022-01-03 NOTE — Telephone Encounter (Signed)
Spoke to pt told her PA for Phentermine was denied by insurance and Lelon Mast said you can use to the Good Rx card. Pt verbalized understanding and said the pharmacy submitted to her insurance by mistake she is using the card. Told her okay, just wanted you to know.

## 2022-01-04 ENCOUNTER — Emergency Department (HOSPITAL_BASED_OUTPATIENT_CLINIC_OR_DEPARTMENT_OTHER): Payer: 59 | Admitting: Radiology

## 2022-01-04 ENCOUNTER — Emergency Department (HOSPITAL_BASED_OUTPATIENT_CLINIC_OR_DEPARTMENT_OTHER): Payer: 59

## 2022-01-04 ENCOUNTER — Emergency Department (HOSPITAL_BASED_OUTPATIENT_CLINIC_OR_DEPARTMENT_OTHER)
Admission: EM | Admit: 2022-01-04 | Discharge: 2022-01-04 | Disposition: A | Discharge status: Home / Self Care | Payer: 59 | Attending: Emergency Medicine | Admitting: Emergency Medicine

## 2022-01-04 ENCOUNTER — Other Ambulatory Visit: Payer: Self-pay

## 2022-01-04 ENCOUNTER — Encounter (HOSPITAL_BASED_OUTPATIENT_CLINIC_OR_DEPARTMENT_OTHER): Payer: Self-pay

## 2022-01-04 DIAGNOSIS — M4316 Spondylolisthesis, lumbar region: Secondary | ICD-10-CM

## 2022-01-04 DIAGNOSIS — Z79899 Other long term (current) drug therapy: Secondary | ICD-10-CM | POA: Insufficient documentation

## 2022-01-04 DIAGNOSIS — Z91040 Latex allergy status: Secondary | ICD-10-CM | POA: Diagnosis not present

## 2022-01-04 DIAGNOSIS — S161XXA Strain of muscle, fascia and tendon at neck level, initial encounter: Secondary | ICD-10-CM

## 2022-01-04 DIAGNOSIS — S199XXA Unspecified injury of neck, initial encounter: Secondary | ICD-10-CM | POA: Diagnosis present

## 2022-01-04 DIAGNOSIS — Z7982 Long term (current) use of aspirin: Secondary | ICD-10-CM | POA: Insufficient documentation

## 2022-01-04 DIAGNOSIS — Y92410 Unspecified street and highway as the place of occurrence of the external cause: Secondary | ICD-10-CM | POA: Insufficient documentation

## 2022-01-04 MED ORDER — CYCLOBENZAPRINE HCL 10 MG PO TABS: 10.0000 mg | ORAL_TABLET | Freq: Two times a day (BID) | ORAL | 0 refills | Status: DC | PRN

## 2022-01-04 MED ORDER — NAPROXEN 375 MG PO TABS: 375.0000 mg | ORAL_TABLET | Freq: Two times a day (BID) | ORAL | 0 refills | Status: DC

## 2022-01-04 NOTE — Discharge Instructions (Signed)
Take the medications as needed for pain and discomfort.  As we discussed the x-rays of your lower lumbar spine did show worsening anterolisthesis.  Follow-up with your primary care doctor to discuss possible referral or further evaluation if your symptoms do not improve within the next week
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
## 2022-01-07 ENCOUNTER — Ambulatory Visit (INDEPENDENT_AMBULATORY_CARE_PROVIDER_SITE_OTHER): Payer: 59 | Admitting: Physician Assistant

## 2022-01-07 ENCOUNTER — Encounter: Payer: Self-pay | Admitting: Physician Assistant

## 2022-01-07 VITALS — BP 120/80 | HR 74 | Temp 98.3°F | Ht 68.0 in | Wt 286.0 lb

## 2022-01-07 DIAGNOSIS — M5441 Lumbago with sciatica, right side: Secondary | ICD-10-CM

## 2022-01-07 MED ORDER — PREDNISONE 20 MG PO TABS: 40.0000 mg | ORAL_TABLET | Freq: Every day | ORAL | 0 refills | Status: DC

## 2022-01-07 NOTE — Patient Instructions (Signed)
It was great to see you!  Appt secured for tomorrow at 3:15p  Take 20 mg prednisone today and start the 40 mg tomorrow  Take care,  [name] PA-C
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
## 2022-01-08 ENCOUNTER — Ambulatory Visit: Payer: 59 | Admitting: Physician Assistant

## 2022-01-08 ENCOUNTER — Ambulatory Visit (INDEPENDENT_AMBULATORY_CARE_PROVIDER_SITE_OTHER): Payer: 59 | Admitting: Family Medicine

## 2022-01-08 VITALS — BP 132/78 | HR 108 | Ht 68.0 in | Wt 284.0 lb

## 2022-01-08 DIAGNOSIS — M5416 Radiculopathy, lumbar region: Secondary | ICD-10-CM

## 2022-01-08 DIAGNOSIS — M4310 Spondylolisthesis, site unspecified: Secondary | ICD-10-CM | POA: Insufficient documentation

## 2022-01-08 DIAGNOSIS — M542 Cervicalgia: Secondary | ICD-10-CM

## 2022-01-08 NOTE — Patient Instructions (Addendum)
Thank you for coming in today.   I've referred you to Physical Therapy.  Let us know if you don't hear from them in one week.   You should hear from MRI scheduling within 1 week. If you do not hear please let me know.    Heating pad.
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
## 2022-01-19 ENCOUNTER — Ambulatory Visit (INDEPENDENT_AMBULATORY_CARE_PROVIDER_SITE_OTHER): Payer: 59

## 2022-01-19 DIAGNOSIS — M4310 Spondylolisthesis, site unspecified: Secondary | ICD-10-CM | POA: Diagnosis not present

## 2022-01-19 DIAGNOSIS — M5416 Radiculopathy, lumbar region: Secondary | ICD-10-CM

## 2022-01-20 ENCOUNTER — Encounter: Payer: Self-pay | Admitting: Family Medicine

## 2022-01-20 NOTE — Progress Notes (Signed)
lumbar spine MRI shows chronic bilateral pars defects at L5 with spondylolisthesis.  This causes potential pinched nerve at L5. This should improve with physical therapy Your lumbar spine MRI does not explain weakness to hip flexion.  Like to see how you do with physical therapy and if not better there is more test to do.

## 2022-01-21 ENCOUNTER — Ambulatory Visit (INDEPENDENT_AMBULATORY_CARE_PROVIDER_SITE_OTHER): Payer: 59 | Admitting: Obstetrics and Gynecology

## 2022-01-21 ENCOUNTER — Telehealth: Payer: Self-pay | Admitting: Family Medicine

## 2022-01-21 ENCOUNTER — Encounter: Payer: Self-pay | Admitting: Obstetrics and Gynecology

## 2022-01-21 VITALS — BP 132/84 | HR 88 | Ht 69.0 in | Wt 289.0 lb

## 2022-01-21 DIAGNOSIS — Z6841 Body Mass Index (BMI) 40.0 and over, adult: Secondary | ICD-10-CM | POA: Diagnosis not present

## 2022-01-21 DIAGNOSIS — R7303 Prediabetes: Secondary | ICD-10-CM

## 2022-01-21 DIAGNOSIS — Z1159 Encounter for screening for other viral diseases: Secondary | ICD-10-CM

## 2022-01-21 DIAGNOSIS — Z8639 Personal history of other endocrine, nutritional and metabolic disease: Secondary | ICD-10-CM

## 2022-01-21 DIAGNOSIS — Z01419 Encounter for gynecological examination (general) (routine) without abnormal findings: Secondary | ICD-10-CM

## 2022-01-21 DIAGNOSIS — R748 Abnormal levels of other serum enzymes: Secondary | ICD-10-CM

## 2022-01-21 DIAGNOSIS — K602 Anal fissure, unspecified: Secondary | ICD-10-CM

## 2022-01-21 DIAGNOSIS — K625 Hemorrhage of anus and rectum: Secondary | ICD-10-CM | POA: Diagnosis not present

## 2022-01-21 MED ORDER — TIZANIDINE HCL 4 MG PO TABS: 4.0000 mg | ORAL_TABLET | Freq: Three times a day (TID) | ORAL | 1 refills | Status: DC | PRN

## 2022-01-21 MED ORDER — MELOXICAM 15 MG PO TABS: 15.0000 mg | ORAL_TABLET | Freq: Every day | ORAL | 1 refills | Status: DC | PRN

## 2022-01-21 NOTE — Telephone Encounter (Signed)
Patient called stating that she is having a lot of constant pain.  She asked if there was anything that Dr Denyse Amass would recommend or prescribe?  Please advise.

## 2022-01-21 NOTE — Telephone Encounter (Signed)
Responded via mychart

## 2022-01-21 NOTE — Patient Instructions (Signed)
Anal Fissure, Adult  An anal fissure is a small tear or crack in the tissue of the anus. Bleeding from a fissure usually stops on its own within a few minutes. However, bleeding will often occur again with each bowel movement until the fissure heals. What are the causes? This condition is usually caused by passing a large or hard stool (feces). Other causes include: Constipation. Frequent diarrhea. Inflammatory bowel disease (Crohn's disease or ulcerative colitis). Childbirth. Infections. Anal sex. What are the signs or symptoms? Symptoms of this condition include: Bleeding from the rectum. Small amounts of blood seen on your stool, on the toilet paper, or in the toilet after a bowel movement. The blood coats the outside of the stool and is not mixed with the stool. Painful bowel movements. Itching or irritation around the anus. How is this diagnosed? A health care provider may diagnose this condition by closely examining the anal area. An anal fissure can usually be seen with careful inspection. In some cases, a rectal exam may be performed, or a short tube (anoscope) may be used to examine the anal canal. How is this treated? Initial treatment for this condition may include: Taking steps to avoid constipation. This may include making changes to your diet, such as increasing your intake of fiber or fluid. Taking fiber supplements. These supplements can soften your stool to help make bowel movements easier. Your health care provider may also prescribe a stool softener if your stool is hard. Taking sitz baths. This may help to heal the tear. Using medicated creams or ointments. These may be prescribed to lessen discomfort. Treatments that are sometimes used if initial treatments do not work well or if the condition is more severe may include: Botulinum injection. Surgery to repair the fissure. Follow these instructions at home: Eating and drinking  Avoid foods that may cause  constipation, such as bananas, milk, and other dairy products. Eat all fruits, except bananas. Drink enough fluid to keep your urine pale yellow. Eat foods that are high in fiber, such as beans, whole grains, and fresh fruits and vegetables. General instructions  Take over-the-counter and prescription medicines only as told by your health care provider. Use creams or ointments only as told by your health care provider. Keep the anal area clean and dry. Take sitz baths as told by your health care provider. Do not use soap in the sitz baths. Keep all follow-up visits as told by your health care provider. This is important. Contact a health care provider if you have: More bleeding. A fever. Diarrhea that is mixed with blood. Pain that continues. Ongoing problems that are getting worse rather than better. Summary An anal fissure is a small tear or crack in the tissue of the anus. This condition is usually caused by passing a large or hard stool (feces). Other causes include constipation and frequent diarrhea. Initial treatment for this condition may include taking steps to avoid constipation, such as increasing your intake of fiber or fluid. Follow instructions for care as told by your health care provider. Contact your health care provider if you have more bleeding or your problem is getting worse rather than better. Keep all follow-up visits as told by your health care provider. This is important. This information is not intended to replace advice given to you by your health care provider. Make sure you discuss any questions you have with your health care provider. Document Revised: 12/20/2020 Document Reviewed: 12/20/2020 Elsevier Patient Education  2023 [employer].  EXERCISE   We recommended that you start or continue a regular exercise program for good health. Physical activity is anything that gets your body moving, some is better than none. The CDC recommends 150 minutes per week  of Moderate-Intensity Aerobic Activity and 2 or more days of Muscle Strengthening Activity.  Benefits of exercise are limitless: helps weight loss/weight maintenance, improves mood and energy, helps with depression and anxiety, improves sleep, tones and strengthens muscles, improves balance, improves bone density, protects from chronic conditions such as heart disease, high blood pressure and diabetes and so much more. To learn more visit: http://kirby-bean.org/  DIET: Good nutrition starts with a healthy diet of fruits, vegetables, whole grains, and lean protein sources. Drink plenty of water for hydration. Minimize empty calories, sodium, sweets. For more information about dietary recommendations visit: CriticalGas.be and https://www.carpenter-henry.info/  ALCOHOL:  Women should limit their alcohol intake to no more than 7 drinks/beers/glasses of wine (combined, not each!) per week. Moderation of alcohol intake to this level decreases your risk of breast cancer and liver damage.  If you are concerned that you may have a problem, or your friends have told you they are concerned about your drinking, there are many resources to help. A well-known program that is free, effective, and available to all people all over the nation is Alcoholics Anonymous.  Check out this site to learn more: BeverageBargains.co.za   CALCIUM AND VITAMIN D:  Adequate intake of calcium and Vitamin D are recommended for bone health.  You should be getting between 1000-1200 mg of calcium and 800 units of Vitamin D daily between diet and supplements  PAP SMEARS:  Pap smears, to check for cervical cancer or precancers,  have traditionally been done yearly, scientific advances have shown that most women can have pap smears less often.  However, every woman still should have a physical exam from her gynecologist every year. It will include a breast check,  inspection of the vulva and vagina to check for abnormal growths or skin changes, a visual exam of the cervix, and then an exam to evaluate the size and shape of the uterus and ovaries. We will also provide age appropriate advice regarding health maintenance, like when you should have certain vaccines, screening for sexually transmitted diseases, bone density testing, colonoscopy, mammograms, etc.   MAMMOGRAMS:  All women over 76 years old should have a routine mammogram.   COLON CANCER SCREENING: Now recommend starting at age 3. At this time colonoscopy is not covered for routine screening until 50. There are take home tests that can be done between 45-49.   COLONOSCOPY:  Colonoscopy to screen for colon cancer is recommended for all women at age 4.  We know, you hate the idea of the prep.  We agree, BUT, having colon cancer and not knowing it is worse!!  Colon cancer so often starts as a polyp that can be seen and removed at colonscopy, which can quite literally save your life!  And if your first colonoscopy is normal and you have no family history of colon cancer, most women don't have to have it again for 10 years.  Once every ten years, you can do something that may end up saving your life, right?  We will be happy to help you get it scheduled when you are ready.  Be sure to check your insurance coverage so you understand how much it will cost.  It may be covered as a preventative service at no cost, but you should  check your particular policy.      Breast Self-Awareness Breast self-awareness means being familiar with how your breasts look and feel. It involves checking your breasts regularly and reporting any changes to your health care provider. Practicing breast self-awareness is important. A change in your breasts can be a sign of a serious medical problem. Being familiar with how your breasts look and feel allows you to find any problems early, when treatment is more likely to be successful.  All women should practice breast self-awareness, including women who have had breast implants. How to do a breast self-exam One way to learn what is normal for your breasts and whether your breasts are changing is to do a breast self-exam. To do a breast self-exam: Look for Changes  Remove all the clothing above your waist. Stand in front of a mirror in a room with good lighting. Put your hands on your hips. Push your hands firmly downward. Compare your breasts in the mirror. Look for differences between them (asymmetry), such as: Differences in shape. Differences in size. Puckers, dips, and bumps in one breast and not the other. Look at each breast for changes in your skin, such as: Redness. Scaly areas. Look for changes in your nipples, such as: Discharge. Bleeding. Dimpling. Redness. A change in position. Feel for Changes Carefully feel your breasts for lumps and changes. It is best to do this while lying on your back on the floor and again while sitting or standing in the shower or tub with soapy water on your skin. Feel each breast in the following way: Place the arm on the side of the breast you are examining above your head. Feel your breast with the other hand. Start in the nipple area and make  inch (2 cm) overlapping circles to feel your breast. Use the pads of your three middle fingers to do this. Apply light pressure, then medium pressure, then firm pressure. The light pressure will allow you to feel the tissue closest to the skin. The medium pressure will allow you to feel the tissue that is a little deeper. The firm pressure will allow you to feel the tissue close to the ribs. Continue the overlapping circles, moving downward over the breast until you feel your ribs below your breast. Move one finger-width toward the center of the body. Continue to use the  inch (2 cm) overlapping circles to feel your breast as you move slowly up toward your collarbone. Continue the up  and down exam using all three pressures until you reach your armpit.  Write Down What You Find  Write down what is normal for each breast and any changes that you find. Keep a written record with breast changes or normal findings for each breast. By writing this information down, you do not need to depend only on memory for size, tenderness, or location. Write down where you are in your menstrual cycle, if you are still menstruating. If you are having trouble noticing differences in your breasts, do not get discouraged. With time you will become more familiar with the variations in your breasts and more comfortable with the exam. How often should I examine my breasts? Examine your breasts every month. If you are breastfeeding, the best time to examine your breasts is after a feeding or after using a breast pump. If you menstruate, the best time to examine your breasts is 5-7 days after your period is over. During your period, your breasts are lumpier, and it may be  more difficult to notice changes. When should I see my health care provider? See your health care provider if you notice: A change in shape or size of your breasts or nipples. A change in the skin of your breast or nipples, such as a reddened or scaly area. Unusual discharge from your nipples. A lump or thick area that was not there before. Pain in your breasts. Anything that concerns you.

## 2022-01-22 LAB — LIPID PANEL
Cholesterol: 217 mg/dL — ABNORMAL HIGH (ref <200)
HDL: 50 mg/dL (ref >=50)
LDL Cholesterol (Calc): 139 mg/dL (calc) — ABNORMAL HIGH
Non-HDL Cholesterol (Calc): 167 mg/dL (calc) — ABNORMAL HIGH (ref <130)
Total CHOL/HDL Ratio: 4.3 (calc) (ref <5.0)
Triglycerides: 151 mg/dL — ABNORMAL HIGH (ref <150)

## 2022-01-22 LAB — CBC
HCT: 40.2 % (ref 35.0–45.0)
Hemoglobin: 13.5 g/dL (ref 11.7–15.5)
MCH: 29.5 pg (ref 27.0–33.0)
MCHC: 33.6 g/dL (ref 32.0–36.0)
MCV: 87.8 fL (ref 80.0–100.0)
MPV: 9.7 fL (ref 7.5–12.5)
Platelets: 376 10*3/uL (ref 140–400)
RBC: 4.58 10*6/uL (ref 3.80–5.10)
RDW: 13 % (ref 11.0–15.0)
WBC: 12 10*3/uL — ABNORMAL HIGH (ref 3.8–10.8)

## 2022-01-22 LAB — COMPREHENSIVE METABOLIC PANEL
AG Ratio: 1.4 (calc) (ref 1.0–2.5)
ALT: 28 U/L (ref 6–29)
AST: 18 U/L (ref 10–30)
Albumin: 4.3 g/dL (ref 3.6–5.1)
Alkaline phosphatase (APISO): 70 U/L (ref 31–125)
BUN: 8 mg/dL (ref 7–25)
CO2: 22 mmol/L (ref 20–32)
Calcium: 8.7 mg/dL (ref 8.6–10.2)
Chloride: 104 mmol/L (ref 98–110)
Creat: 0.69 mg/dL (ref 0.50–0.97)
Globulin: 3 g/dL (calc) (ref 1.9–3.7)
Glucose, Bld: 90 mg/dL (ref 65–99)
Potassium: 4 mmol/L (ref 3.5–5.3)
Sodium: 137 mmol/L (ref 135–146)
Total Bilirubin: 0.8 mg/dL (ref 0.2–1.2)
Total Protein: 7.3 g/dL (ref 6.1–8.1)

## 2022-01-22 LAB — HEPATITIS C ANTIBODY: Hepatitis C Ab: NONREACTIVE

## 2022-01-22 LAB — HEMOGLOBIN A1C
Hgb A1c MFr Bld: 5.4 % of total Hgb (ref <5.7)
Mean Plasma Glucose: 108 mg/dL
eAG (mmol/L): 6 mmol/L

## 2022-01-27 ENCOUNTER — Ambulatory Visit (INDEPENDENT_AMBULATORY_CARE_PROVIDER_SITE_OTHER): Payer: 59 | Admitting: Physical Therapy

## 2022-01-27 DIAGNOSIS — M5459 Other low back pain: Secondary | ICD-10-CM
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
## 2022-01-28 ENCOUNTER — Encounter: Payer: Self-pay | Admitting: Physical Therapy

## 2022-02-03 ENCOUNTER — Ambulatory Visit (INDEPENDENT_AMBULATORY_CARE_PROVIDER_SITE_OTHER): Payer: 59 | Admitting: Physical Therapy

## 2022-02-03 ENCOUNTER — Ambulatory Visit (INDEPENDENT_AMBULATORY_CARE_PROVIDER_SITE_OTHER): Payer: 59 | Admitting: Family Medicine

## 2022-02-03 VITALS — BP 138/84 | HR 102 | Ht 69.0 in | Wt 295.2 lb

## 2022-02-03 DIAGNOSIS — M4310 Spondylolisthesis, site unspecified: Secondary | ICD-10-CM | POA: Diagnosis not present

## 2022-02-03 DIAGNOSIS — G8929 Other chronic pain: Secondary | ICD-10-CM

## 2022-02-03 DIAGNOSIS — M5441 Lumbago with sciatica, right side: Secondary | ICD-10-CM

## 2022-02-03 DIAGNOSIS — M5459 Other low back pain: Secondary | ICD-10-CM | POA: Diagnosis not present

## 2022-02-03 DIAGNOSIS — M5416 Radiculopathy, lumbar region: Secondary | ICD-10-CM | POA: Diagnosis not present

## 2022-02-03 MED ORDER — TRAMADOL HCL 50 MG PO TABS: 50.0000 mg | ORAL_TABLET | Freq: Three times a day (TID) | ORAL | 0 refills | Status: DC | PRN

## 2022-02-03 NOTE — Patient Instructions (Signed)
Thank you for coming in today.   Continue PT.   Please call [facility] at [phone] to schedule your spine injection.    Let me know how you feel after the shot. We may need to do more than one.
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
## 2022-02-11 ENCOUNTER — Ambulatory Visit
Admission: RE | Admit: 2022-02-11 | Discharge: 2022-02-11 | Disposition: A | Discharge status: Home / Self Care | Payer: 59 | Source: Ambulatory Visit | Attending: Family Medicine | Admitting: Family Medicine

## 2022-02-11 DIAGNOSIS — M5416 Radiculopathy, lumbar region: Secondary | ICD-10-CM

## 2022-02-11 DIAGNOSIS — M4310 Spondylolisthesis, site unspecified: Secondary | ICD-10-CM

## 2022-02-11 MED ORDER — IOPAMIDOL (ISOVUE-M 200) INJECTION 41%
1.0000 mL | Freq: Once | INTRAMUSCULAR | Status: AC
  Administered 2022-02-11: 1 mL via EPIDURAL

## 2022-02-11 MED ORDER — METHYLPREDNISOLONE ACETATE 40 MG/ML INJ SUSP (RADIOLOG
80.0000 mg | Freq: Once | INTRAMUSCULAR | Status: AC
  Administered 2022-02-11: 80 mg via EPIDURAL

## 2022-02-11 NOTE — Discharge Instructions (Signed)

## 2022-02-12 ENCOUNTER — Encounter: Payer: 59 | Admitting: Physical Therapy

## 2022-02-17 ENCOUNTER — Ambulatory Visit: Payer: 59 | Admitting: Dietician

## 2022-02-17 ENCOUNTER — Ambulatory Visit (INDEPENDENT_AMBULATORY_CARE_PROVIDER_SITE_OTHER): Payer: 59 | Admitting: Physical Therapy

## 2022-02-17 ENCOUNTER — Encounter: Payer: Self-pay | Admitting: Physical Therapy

## 2022-02-17 DIAGNOSIS — M5459 Other low back pain: Secondary | ICD-10-CM | POA: Diagnosis not present
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
## 2022-02-19 ENCOUNTER — Encounter: Payer: 59 | Admitting: Physical Therapy

## 2022-02-21 ENCOUNTER — Encounter: Payer: Self-pay | Admitting: Family Medicine

## 2022-02-21 DIAGNOSIS — M47816 Spondylosis without myelopathy or radiculopathy, lumbar region: Secondary | ICD-10-CM

## 2022-02-21 DIAGNOSIS — G8929 Other chronic pain: Secondary | ICD-10-CM

## 2022-03-03 ENCOUNTER — Encounter: Payer: Self-pay | Admitting: *Deleted

## 2022-03-06 ENCOUNTER — Encounter: Payer: Self-pay | Admitting: Physical Therapy

## 2022-03-06 ENCOUNTER — Ambulatory Visit (INDEPENDENT_AMBULATORY_CARE_PROVIDER_SITE_OTHER): Payer: 59 | Admitting: Physical Therapy

## 2022-03-06 DIAGNOSIS — M5459 Other low back pain: Secondary | ICD-10-CM | POA: Diagnosis not present
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
## 2022-03-10 ENCOUNTER — Ambulatory Visit
Admission: RE | Admit: 2022-03-10 | Discharge: 2022-03-10 | Disposition: A | Discharge status: Home / Self Care | Payer: 59 | Source: Ambulatory Visit | Attending: Family Medicine | Admitting: Family Medicine

## 2022-03-10 ENCOUNTER — Encounter: Payer: 59 | Admitting: Physical Therapy

## 2022-03-10 DIAGNOSIS — M545 Low back pain, unspecified: Secondary | ICD-10-CM

## 2022-03-10 DIAGNOSIS — M47816 Spondylosis without myelopathy or radiculopathy, lumbar region: Secondary | ICD-10-CM

## 2022-03-10 DIAGNOSIS — G8929 Other chronic pain: Secondary | ICD-10-CM

## 2022-03-10 MED ORDER — METHYLPREDNISOLONE ACETATE 40 MG/ML INJ SUSP (RADIOLOG: 80.0000 mg | Freq: Once | INTRAMUSCULAR | Status: DC

## 2022-03-10 MED ORDER — IOPAMIDOL (ISOVUE-M 200) INJECTION 41%: 1.0000 mL | Freq: Once | INTRAMUSCULAR | Status: DC

## 2022-03-10 NOTE — Discharge Instructions (Signed)

## 2022-03-13 ENCOUNTER — Encounter: Payer: 59 | Admitting: Physical Therapy

## 2022-03-17 ENCOUNTER — Encounter: Payer: 59 | Admitting: Physical Therapy

## 2022-03-18 ENCOUNTER — Other Ambulatory Visit: Payer: Self-pay | Admitting: Family Medicine

## 2022-03-18 NOTE — Addendum Note (Signed)
Addended by: Gregor Hams on: 03/18/2022 08:45 AM   Modules accepted: Orders

## 2022-03-20 ENCOUNTER — Encounter: Payer: 59 | Admitting: Physical Therapy

## 2022-03-24 ENCOUNTER — Telehealth: Payer: Self-pay

## 2022-03-24 DIAGNOSIS — M47816 Spondylosis without myelopathy or radiculopathy, lumbar region: Secondary | ICD-10-CM

## 2022-03-24 DIAGNOSIS — M545 Low back pain, unspecified: Secondary | ICD-10-CM

## 2022-03-24 NOTE — Telephone Encounter (Signed)
Patient is having a lot of pain and we are having a lot of trouble getting patient in with [place] [facility]. Patient wanting to know if there is anywhere else we can send her somewhere else. I informed patient that we do send to [facility] in [place] and she would like a referral sent there as well for a second opinion. I have ordered and faxed this referral for patient

## 2022-03-28 ENCOUNTER — Ambulatory Visit: Payer: 59 | Admitting: Physician Assistant

## 2022-04-01 ENCOUNTER — Encounter: Payer: Self-pay | Admitting: Physician Assistant

## 2022-04-01 ENCOUNTER — Ambulatory Visit (INDEPENDENT_AMBULATORY_CARE_PROVIDER_SITE_OTHER): Payer: 59 | Admitting: Physician Assistant

## 2022-04-01 VITALS — BP 130/82 | HR 81 | Temp 98.0°F | Ht 69.0 in | Wt 298.0 lb

## 2022-04-01 DIAGNOSIS — F419 Anxiety disorder, unspecified: Secondary | ICD-10-CM | POA: Diagnosis not present

## 2022-04-01 DIAGNOSIS — F32A Depression, unspecified: Secondary | ICD-10-CM

## 2022-04-01 DIAGNOSIS — M5441 Lumbago with sciatica, right side: Secondary | ICD-10-CM | POA: Diagnosis not present

## 2022-04-01 MED ORDER — DIAZEPAM 2 MG PO TABS: 2.0000 mg | ORAL_TABLET | Freq: Every evening | ORAL | 0 refills | Status: DC
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
## 2022-04-02 ENCOUNTER — Ambulatory Visit (INDEPENDENT_AMBULATORY_CARE_PROVIDER_SITE_OTHER): Payer: 59 | Admitting: Family Medicine

## 2022-04-02 VITALS — BP 110/70 | HR 86 | Ht 69.0 in

## 2022-04-02 DIAGNOSIS — M4310 Spondylolisthesis, site unspecified: Secondary | ICD-10-CM

## 2022-04-02 DIAGNOSIS — G8929 Other chronic pain: Secondary | ICD-10-CM

## 2022-04-02 DIAGNOSIS — M545 Low back pain, unspecified: Secondary | ICD-10-CM | POA: Diagnosis not present

## 2022-04-02 MED ORDER — HYDROCODONE-ACETAMINOPHEN 5-325 MG PO TABS: 1.0000 | ORAL_TABLET | Freq: Four times a day (QID) | ORAL | 0 refills | Status: DC | PRN

## 2022-04-02 NOTE — Progress Notes (Unsigned)
Amanda Davis, am serving as a Education administrator for Dr. Hulan Saas.  Amanda Davis is a 36 y.o. female who presents to [place] [facility] today for acute exacerbation of chronic back pain. Amanda Davis has had worsening significant back pain recently.  She was last seen in my office on August 28 following an MRI.  At that time planned to proceed to epidural steroid injection and facet injection planning.  Ultimately she did see [place] [facility] for surgical consultation regarding her L5 defects with spondylolisthesis.    She reports that she was seen at [place] [facility] but did not have a good experience that she was not given the time a day and really felt like she was waisting the doctors time being there. Patient was ultimately told she was too young to be there, that she hadn't tried a nerve block, and that patients are really only sent to them if back surgery is needed. Patient was referred to [facility] neurology as well when we were having trouble getting the referral to [place] [facility], she is scheduled with them nov. 29th and feels there is a little bit of hope with their office.  Patient is not sleeping well gets about 2-3 hours of sleep a night because the back pain is severe. Patient saw her PCP and was given valium to help her sleep, but she has only had one night and is going to try and give it time to work. Patient was told by her PCP to follow up with our office.     Pertinent review of systems: ***  Relevant historical information: ***   Exam:  LMP 03/31/2022 (Exact Date)  General: Well Developed, well nourished, and in no acute distress.   MSK: ***    Lab and Radiology Results  EXAM: MRI LUMBAR SPINE WITHOUT CONTRAST   TECHNIQUE: Multiplanar, multisequence MR imaging of the lumbar spine was performed. No intravenous contrast was administered.   COMPARISON:  Prior radiograph from 01/04/2022.   FINDINGS: Segmentation: Standard. Lowest  well-formed disc space labeled the L5-S1 level.   Alignment: Chronic bilateral pars defects at L5 with associated 5 mm spondylolisthesis. Vertebral bodies otherwise normally aligned with preservation of the normal lumbar lordosis.   Vertebrae: Vertebral body height maintained without acute or chronic fracture. Bone marrow signal intensity diffusely decreased on T1 weighted sequence, nonspecific, but most commonly related to anemia, smoking or obesity. Small benign hemangioma noted within the S1 segment. No other discrete or worrisome osseous lesions. Mild discogenic reactive endplate change present about the L5-S1 interspace. No other abnormal marrow edema.   Conus medullaris and cauda equina: Conus extends to the L1 level. Conus and cauda equina appear normal.   Paraspinal and other soft tissues: Paraspinous soft tissues within normal limits. 1.5 cm simple cyst present within the interpolar right kidney, benign in appearance, no follow-up imaging recommended. Visualized visceral structures otherwise unremarkable.   Disc levels:   T11-12: Seen only on sagittal projection. Minimal disc bulge with endplate spurring. No significant stenosis.   T12-L1 through L3-4: Unremarkable.   L4-5: Normal interspace. Moderate bilateral facet hypertrophy. No significant spinal stenosis. Foramina remain patent.   L5-S1: Chronic bilateral pars defects with associated 5 mm spondylolisthesis. Associated disc desiccation with broad posterior pseudo disc bulge/uncovering. Reactive endplate spurring. Moderate facet hypertrophy. Mild epidural lipomatosis. No significant spinal stenosis. Moderate bilateral L5 foraminal narrowing due to disc bulging and slippage.   IMPRESSION: 1. Chronic bilateral pars defects at L5 with associated 5 mm spondylolisthesis,  resulting in moderate bilateral L5 foraminal stenosis. 2. Moderate bilateral facet hypertrophy at L4-5 without stenosis. 3. Otherwise essentially  normal MRI of the lumbar spine. No other significant disc pathology, stenosis, or evidence for neural impingement.     Electronically Signed   By: Jeannine Boga M.D.   On: 01/20/2022 05:24 I, Lynne Leader, personally (independently) visualized and performed the interpretation of the images attached in this note.     Assessment and Plan: 36 y.o. female with ***   PDMP not reviewed this encounter. No orders of the defined types were placed in this encounter.  No orders of the defined types were placed in this encounter.    Discussed warning signs or symptoms. Please see discharge instructions. Patient expresses understanding.   ***

## 2022-04-02 NOTE — Patient Instructions (Signed)
Thank you for coming in today.   I prescribed hydrocodone.  Take at bedtime.  Let me know.   I think you will be able to get in with Dr Lynann Bologna before Nov 20th.  Let me know what he says.   Keep me updated.

## 2022-05-21 ENCOUNTER — Telehealth: Payer: Self-pay | Admitting: Physician Assistant

## 2022-05-21 NOTE — Telephone Encounter (Signed)
Patient Name: Amanda Davis [facility] Gender: Female DOB: Apr 28, 1986 Age: 36 Y 1 M 8 D Return Phone Number: [phone] (Primary) Address: City/ State/ Zip: [place]  [address] Client [facility] - Administrator, [facility] at [facility] Provider Bufford Buttner, Portal- PA Contact Type Call Who Is Calling Patient / Member / Family / Caregiver Call Type Triage / Clinical Relationship To Patient Self Return Phone Number [phone] (Primary) Chief Complaint BREATHING - shortness of breath or sounds breathless Reason for Call Symptomatic / Request for Health Information Initial Comment Caller states she has a severe cough for over a week now, shortness of breath, congestion in her chest. Translation No Nurse Assessment Nurse: Charna Elizabeth, RN, Lynden Ang Date/Time (Eastern Time): 05/21/2022 10:39:03 AM Confirm and document reason for call. If symptomatic, describe symptoms. ---Caller states she developed cold, cough and chest congestion (no chest pain or pressure) symptoms about a week ago. She developed shortness of breath with talking about 2 days ago. No wheezing or stridor. Alert and responsive. No fever. (COVID test negative) Does the patient have any new or worsening symptoms? ---Yes Will a triage be completed? ---Yes Related visit to physician within the last 2 weeks? ---No Does the PT have any chronic conditions? (i.e. diabetes, asthma, this includes High risk factors for pregnancy, etc.) ---Yes List chronic conditions. ---Chronic back pain Is this a behavioral health or substance abuse call? ---No Guidelines Guideline Title Affirmed Question Affirmed Notes Nurse Date/Time (Eastern Time) Breathing Difficulty [1] MODERATE difficulty breathing (e.g., speaks in phrases, SOB even at rest, pulse 100-120) [place], RN, Lynden Ang 05/21/2022 10:42:32 AM  Guidelines Guideline Title Affirmed Question Affirmed Notes Nurse  Date/Time Lamount Cohen Time) AND [2] NEW-onset or WORSE than normal Disp. Time Lamount Cohen Time) Disposition Final User 05/21/2022 10:37:43 AM Send to Urgent Queue Dennison Mascot 05/21/2022 10:46:16 AM Go to ED Now Yes Charna Elizabeth, RN, Lynden Ang Final Disposition 05/21/2022 10:46:16 AM Go to ED Now Yes Charna Elizabeth, RN, Frann Rider Disagree/Comply Comply Caller Understands Yes PreDisposition Go to Urgent Care/Walk-In Clinic Care Advice Given Per Guideline GO TO ED NOW: * You need to be seen in the Emergency Department. * Go to the ED at ___________ Hospital. * Leave now. Drive carefully. NOTE TO TRIAGER - DRIVING: * Another adult should drive. * Patient should not delay going to the emergency department. CALL EMS 911 IF: * Call EMS if you become worse. CARE ADVICE given per Breathing Difficulty (Adult) guideline. BRING MEDICINES: * Bring a list of your current medicines when you go to the Emergency Department (ER). * Bring the pill bottles too. This will help the doctor (or NP/PA) to make certain you are taking the right medicines and the right dose. Referrals [facility] - ED

## 2022-05-21 NOTE — Telephone Encounter (Signed)
Patient states she has had a severe cough for over a week and that she now has shortness of breath -especially while talking  Also, when lying flat Patient states she feels congestion in her chest.  Transferred to Triage Corrie Dandy)

## 2022-05-26 ENCOUNTER — Encounter: Payer: Self-pay | Admitting: Physician Assistant

## 2022-05-26 ENCOUNTER — Ambulatory Visit (INDEPENDENT_AMBULATORY_CARE_PROVIDER_SITE_OTHER): Payer: 59 | Admitting: Physician Assistant

## 2022-05-26 VITALS — BP 126/90 | HR 70 | Temp 98.2°F | Ht 69.0 in | Wt 309.5 lb

## 2022-05-26 DIAGNOSIS — J189 Pneumonia, unspecified organism: Secondary | ICD-10-CM | POA: Diagnosis not present

## 2022-05-26 MED ORDER — AMOXICILLIN-POT CLAVULANATE 875-125 MG PO TABS: 1.0000 | ORAL_TABLET | Freq: Two times a day (BID) | ORAL | 0 refills | Status: DC

## 2022-05-26 MED ORDER — PREDNISONE 50 MG PO TABS: ORAL_TABLET | ORAL | 0 refills | Status: DC

## 2022-05-26 NOTE — Progress Notes (Signed)
Amanda Davis is a 36 y.o. female here for a follow up of a pre-existing problem.  History of Present Illness:   Chief Complaint  Patient presents with   f/u pneumonia    Pt c/o cough and expectorating yellow/ white sputum. No fever, chills yesterday. SOB    HPI  Right lower Lobe Pneumonia: Patient reports she went to the urgent care on 05/21/2022 and had an X-ray there which showed right lower lobe pneumonia. She was prescribed doxycycline (Vibramycin) 100 MG BID and has been taking it for 5 days. Patient complains of right chest pain and dry cough during this visit. She also complains of shortness of breath when she walks and occasionally when she talks. Patient reports her SpO2 has been around 92-94% at home. She reports that she had nasal congestion, rhinorrhea, and shortness of breath a week prior to going to the urgent care on 05/21/2022.   Patient denies leg swelling, body pain, and fever. She notes her congestion has been getting better.     Past Medical History:  Diagnosis Date   Abnormal Pap smear of cervix ~2005   colpo biopsy   Allergy    Latex allergy   Anxiety    Depression    Hemorrhage after delivery of fetus 2011   Hyperlipidemia 03/12/2021   SVD (spontaneous vaginal delivery)    x 2     Social History   Tobacco Use   Smoking status: Former    Packs/day: 0.25    Years: 7.00    Total pack years: 1.75    Types: Cigarettes    Quit date: 07/26/2014    Years since quitting: 7.8   Smokeless tobacco: Never  Vaping Use   Vaping Use: Never used  Substance Use Topics   Alcohol use: Yes    Alcohol/week: 1.0 - 2.0 standard drink of alcohol    Types: 1 - 2 Glasses of wine per week   Drug use: No    Past Surgical History:  Procedure Laterality Date   APPENDECTOMY  2003   BREAST SURGERY Bilateral 11/14/2002   Breast Reduction    COLPOSCOPY  ~2005   Normal - per pt   KNEE SURGERY     left   LAPAROSCOPIC BILATERAL SALPINGECTOMY N/A 02/28/2016    Procedure: LAPAROSCOPIC BILATERAL SALPINGECTOMY;  Surgeon: Romualdo Bolk, MD;  Location: [facility];  Service: Gynecology;  Laterality: N/A;  Beth RNFA   TUBAL LIGATION     WISDOM TOOTH EXTRACTION      Family History  Adopted: Yes  Problem Relation Age of Onset   Stroke Maternal Grandmother    Heart disease Maternal Grandmother     Allergies  Allergen Reactions   Latex Rash    Current Medications:   Current Outpatient Medications:    ALPRAZolam (XANAX) 0.25 MG tablet, Take 1 tablet (0.25 mg total) by mouth 2 (two) times daily as needed for anxiety., Disp: 30 tablet, Rfl: 1   amoxicillin-clavulanate (AUGMENTIN) 875-125 MG tablet, Take 1 tablet by mouth 2 (two) times daily., Disp: 14 tablet, Rfl: 0   aspirin-acetaminophen-caffeine (EXCEDRIN MIGRAINE) 250-250-65 MG tablet, Take 2 tablets by mouth every 6 (six) hours as needed for headache., Disp: , Rfl:    doxycycline (VIBRAMYCIN) 100 MG capsule, Take 100 mg by mouth 2 (two) times daily., Disp: , Rfl:    gabapentin (NEURONTIN) 100 MG capsule, Take 100 mg by mouth as needed., Disp: , Rfl:    magnesium (MAGTAB) 84 MG ( ) TBCR SR tablet, Take  250 mg by mouth., Disp: , Rfl:    Multiple Vitamin (MULTIVITAMIN) tablet, Take 1 tablet by mouth at bedtime. , Disp: , Rfl:    phentermine (ADIPEX-P) 37.5 MG tablet, Take 1 tablet (37.5 mg total) by mouth daily before breakfast., Disp: 30 tablet, Rfl: 2   predniSONE (DELTASONE) 50 MG tablet, Take 1 tablet daily x 5 days, Disp: 5 tablet, Rfl: 0   vitamin B-12 (CYANOCOBALAMIN) 500 MCG tablet, Take 500 mcg by mouth daily., Disp: , Rfl:    Review of Systems:   Review of Systems  Constitutional:  Negative for chills, fever, malaise/fatigue and weight loss.  HENT:  Positive for congestion (Mild congestion). Negative for hearing loss, sinus pain and sore throat.   Respiratory:  Positive for cough and shortness of breath (When walking). Negative for hemoptysis.   Cardiovascular:  Positive for chest  pain (Right chest pain). Negative for palpitations, orthopnea, leg swelling and PND.  Gastrointestinal:  Negative for abdominal pain, constipation, diarrhea, heartburn, nausea and vomiting.  Genitourinary:  Negative for dysuria, frequency and urgency.  Musculoskeletal:  Negative for back pain, myalgias and neck pain.  Skin:  Negative for itching and rash.  Endo/Heme/Allergies:  Negative for polydipsia.  Psychiatric/Behavioral:  Negative for depression. The patient is not nervous/anxious.     Vitals:   Vitals:   05/26/22 1135  BP: (!) 126/90  Pulse: 70  Temp: 98.2 F (36.8 C)  TempSrc: Temporal  SpO2: 98%  Weight: (!) 309 lb 8 oz (140.4 kg)  Height: 5\' 9"  (1.753 m)     Body mass index is 45.71 kg/m.  Physical Exam:   Physical Exam Vitals and nursing note reviewed.  Constitutional:      General: She is not in acute distress.    Appearance: Normal appearance. She is well-developed. She is not ill-appearing or toxic-appearing.  HENT:     Head: Normocephalic and atraumatic.     Right Ear: Tympanic membrane, ear canal and external ear normal. Tympanic membrane is not erythematous, retracted or bulging.     Left Ear: Tympanic membrane, ear canal and external ear normal. Tympanic membrane is not erythematous, retracted or bulging.     Nose: Nose normal.     Right Sinus: No maxillary sinus tenderness or frontal sinus tenderness.     Left Sinus: No maxillary sinus tenderness or frontal sinus tenderness.     Mouth/Throat:     Pharynx: Uvula midline. No posterior oropharyngeal erythema.  Eyes:     General: Lids are normal.     Extraocular Movements: Extraocular movements intact.     Conjunctiva/sclera: Conjunctivae normal.     Pupils: Pupils are equal, round, and reactive to light.  Neck:     Trachea: Trachea normal.  Cardiovascular:     Rate and Rhythm: Normal rate and regular rhythm.     Pulses: Normal pulses.     Heart sounds: Normal heart sounds, S1 normal and S2 normal. No  murmur heard.    No gallop.  Pulmonary:     Effort: Pulmonary effort is normal. No respiratory distress.     Breath sounds: Normal breath sounds. No decreased breath sounds, wheezing, rhonchi or rales.     Comments: Course breath sounds throughout  Lymphadenopathy:     Cervical: No cervical adenopathy.  Skin:    General: Skin is warm and dry.  Neurological:     Mental Status: She is alert and oriented to person, place, and time.  Psychiatric:  Speech: Speech normal.        Behavior: Behavior normal. Behavior is cooperative.        Judgment: Judgment normal.     Assessment and Plan:   Pneumonia of right lower lobe due to infectious organism No red flags on exam PERC score is 0 Add on augmentin Add 50 mg prednisone x 5 days Continue to monitory home oxygen - call if consistently <94, reach out If new/worsening sx, recommend reach out Repeat CXR in 1 month  I,Param Shah,acting as a scribe for [employer], PA.,have documented all relevant documentation on the behalf of [name], PA,as directed by  [name], PA while in the presence of [name], [place].  I, [name], [place], have reviewed all documentation for this visit. The documentation on 05/26/22 for the exam, diagnosis, procedures, and orders are all accurate and complete.  [name], PA-C

## 2022-05-26 NOTE — Patient Instructions (Addendum)
It was great to see you!  An order for xray has been put in for you. Please get this done in mid-January to follow-up on your pneumonia. To have this done, you can walk in at the [facility] location without a scheduled appointment.  The address is [address] It is across the street from [facility]. Lab and x-xray are located in the basement.   Hours of operation are M-F 8:30am to 5:00pm.  Please note that they are closed for lunch between 12:30 and 1:00pm.   Take care,  [name] PA-C

## 2022-07-02 ENCOUNTER — Ambulatory Visit: Payer: 59 | Admitting: Physician Assistant

## 2022-07-08 DIAGNOSIS — M47816 Spondylosis without myelopathy or radiculopathy, lumbar region: Secondary | ICD-10-CM | POA: Insufficient documentation

## 2022-07-08 DIAGNOSIS — G8929 Other chronic pain: Secondary | ICD-10-CM | POA: Insufficient documentation

## 2022-08-11 ENCOUNTER — Telehealth: Payer: Self-pay

## 2022-08-11 NOTE — Transitions of Care (Post Inpatient/ED Visit) (Signed)
   08/11/2022  Name: Amanda Davis MRN: [id number] DOB: November 08, 1985  Pt currently still admitted- no TCM done

## 2022-08-26 ENCOUNTER — Ambulatory Visit (INDEPENDENT_AMBULATORY_CARE_PROVIDER_SITE_OTHER): Payer: 59 | Admitting: Obstetrics and Gynecology

## 2022-08-26 ENCOUNTER — Encounter: Payer: Self-pay | Admitting: Obstetrics and Gynecology

## 2022-08-26 VITALS — BP 122/74 | HR 89 | Wt 307.0 lb

## 2022-08-26 DIAGNOSIS — N644 Mastodynia: Secondary | ICD-10-CM

## 2022-08-26 NOTE — Patient Instructions (Signed)
Breast Tenderness Breast tenderness is a common problem for women of all ages, but may also occur in men. Breast tenderness has many possible causes, including hormone changes, infections, taking certain medicines, and caffeine intake. In women, the pain usually comes and goes with the menstrual cycle, but it can also be constant. Breast tenderness may range from mild discomfort to severe pain. You may have tests, such as a mammogram or an ultrasound, to check for any unusual findings. Having breast tenderness usually does not mean that you have breast cancer. Follow these instructions at home: Managing pain and discomfort  If directed, put ice on the painful area. To do this: Put ice in a plastic bag. Place a towel between your skin and the bag. Leave the ice on for 20 minutes, 2-3 times a day. If your skin turns bright red, remove the ice right away to prevent skin damage. The risk of skin damage is higher if you cannot feel pain, heat, or cold. Wear a supportive bra or chest support: During exercise. While sleeping, if your breasts are very tender. Medicines Take over-the-counter and prescription medicines only as told by your health care provider. If the cause of your pain is an infection, you may be prescribed an antibiotic medicine. If you were prescribed antibiotics, take them as told by your health care provider. Do not stop using the antibiotic even if you start to feel better. Eating and drinking Decrease the amount of caffeine in your diet. Instead, drink more water and choose caffeine-free drinks. Your health care provider may recommend that you lessen the amount of fat in your diet. You can do this by: Limiting fried foods. Cooking foods using methods such as baking, boiling, grilling, and broiling. General instructions  Keep a log of the days and times when your breasts are most tender. Ask your health care provider how to do breast exams at home. This will help you notice if  you have an unusual growth or lump. Keep all follow-up visits. Contact a health care provider if: Any part of your breast is hard, red, and hot to the touch. This may be a sign of infection. You are a woman and have a new or painful lump in your breast that remains after your menstrual period ends. You are not breastfeeding and you have fluid, especially blood or pus, coming out of your nipples. You have a fever. Your pain does not improve or it gets worse. Your pain is interfering with your daily activities. Summary Breast tenderness may range from mild discomfort to severe pain. Breast tenderness has many possible causes, including hormone changes, infections, taking certain medicines, and caffeine intake. It can be treated with ice, wearing a supportive bra or chest support, and medicines. Make changes to your diet as told by your health care provider. This information is not intended to replace advice given to you by your health care provider. Make sure you discuss any questions you have with your health care provider. Document Revised: 08/07/2021 Document Reviewed: 08/07/2021 Elsevier Patient Education  2023 Elsevier Inc.  

## 2022-08-26 NOTE — Progress Notes (Signed)
GYNECOLOGY  VISIT   HPI: 37 y.o.   Divorced White or Caucasian Not Hispanic or Latino  female   [phone] with Patient's last menstrual period was 08/05/2022.   here for right side breast pain that started after she had back surgery on 08/08/22. The surgery was 8 hours long and involved both an anterior and posterior approach.  The pain is in the lateral part of her right breast. It started with her surgery and is not improving. Her right lateral breast is tender to touch and she is having intermittent sharp pain. The pain is up to a 5/10 in severity.    GYNECOLOGIC HISTORY: Patient's last menstrual period was 08/05/2022. 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   Obesity 03/12/2021   Hyperlipidemia 03/12/2021   Anxiety and depression 03/12/2021    Past Medical History:  Diagnosis Date   Abnormal Pap smear of cervix ~2005   colpo biopsy   Allergy    Latex allergy   Anxiety    Depression    Hemorrhage after delivery of fetus 2011   Hyperlipidemia 03/12/2021   SVD (spontaneous vaginal delivery)    x 2    Past Surgical History:  Procedure Laterality Date   APPENDECTOMY  2003   BREAST SURGERY Bilateral 11/14/2002   Breast Reduction    COLPOSCOPY  ~2005   Normal - per pt   KNEE SURGERY     left   LAPAROSCOPIC BILATERAL SALPINGECTOMY N/A 02/28/2016   Procedure: LAPAROSCOPIC BILATERAL SALPINGECTOMY;  Surgeon: Salvadore Dom, MD;  Location: [facility];  Service: Gynecology;  Laterality: N/A;  Beth RNFA   TUBAL LIGATION     WISDOM TOOTH EXTRACTION      Current Outpatient Medications  Medication Sig Dispense Refill   ALPRAZolam (XANAX) 0.25 MG tablet Take 1 tablet  (0.25 mg total) by mouth 2 (two) times daily as needed for anxiety. 30 tablet 1   aspirin-acetaminophen-caffeine (EXCEDRIN MIGRAINE) 250-250-65 MG tablet Take 2 tablets by mouth every 6 (six) hours as needed for headache.     magnesium (MAGTAB) 84 MG (7MEQ) TBCR SR tablet Take 250 mg by mouth.     Multiple Vitamin (MULTIVITAMIN) tablet Take 1 tablet by mouth at bedtime.      vitamin B-12 (CYANOCOBALAMIN) 500 MCG tablet Take 500 mcg by mouth daily.     No current facility-administered medications for this visit.     ALLERGIES: Tape, Gabapentin, and Latex  Family History  Adopted: Yes  Problem Relation Age of Onset   Stroke Maternal Grandmother    Heart disease Maternal Grandmother     Social History   Socioeconomic History   Marital status: Divorced    Spouse name: Not on file   Number of children: Not on file   Years of education: Not on file   Highest education level: Not on file  Occupational History   Not on file  Tobacco Use  Smoking status: Former    Packs/day: 0.25    Years: 7.00    Additional pack years: 0.00    Total pack years: 1.75    Types: Cigarettes    Quit date: 07/26/2014    Years since quitting: 8.0   Smokeless tobacco: Never  Vaping Use   Vaping Use: Never used  Substance and Sexual Activity   Alcohol use: Yes    Alcohol/week: 1.0 - 2.0 standard drink of alcohol    Types: 1 - 2 Glasses of wine per week   Drug use: No   Sexual activity: Yes    Birth control/protection: Surgical    Comment: tubal ligation  Other Topics Concern   Not on file  Social History Narrative   Single mom   1 and 72 yo children   Work for [employer]    Market researcher in [place] Strain: Not on file  Food Insecurity: Not on file  Transportation Needs: Not on file  Physical Activity: Not on file  Stress: Not on file  Social Connections: Not on file  Intimate Partner Violence: Not on file    Review  of Systems  All other systems reviewed and are negative.   PHYSICAL EXAMINATION:    BP 122/74   Pulse 89   Wt (!) 307 lb (139.3 kg)   LMP 08/05/2022   SpO2 100%   BMI 45.34 kg/m     General appearance: alert, cooperative and appears stated age Breasts:  tender to touch in the lateral right breast, no lumps No axillary adenopathy  1. Breast pain Tender on exam, no lumps. I suspect her pain is from how she was positioned during a long surgical procedure. -Try ibuprofen -Can use ice -Make sure she is wearing a well fitting bra -If her pain doesn't improve over the next 2 weeks, she will call and I will set up diagnostic imaging.

## 2022-10-08 NOTE — Progress Notes (Signed)
Subjective:    Amanda Davis is a 37 y.o. female and is here for a comprehensive physical exam.  HPI  There are no preventive care reminders to display for this patient.   Acute Concerns: None  Chronic Issues: Anxiety and Depression Treated with alprazolam 0.25 mg twice daily as needed. This is helping symptoms without negative side effects. Denies SI/HI.  Dependent Edema Notes swelling in lower legs and ankles since 10/01/22. The right is more swollen than the left. She has tried elevated her feet while working, walking frequently. Swelling does not resolve in the morning. Denies numbness, tingling, shortness of breath, chest pain. Has not traveled recently. Denies other swelling.  Back Pain Treated with methocarbamol 500 mg at bedtime. Her back pain has not improved. She sees her neurosurgeon on 10/30/22 for her second post-op appointment. Status post lumbar fusion 08/08/22. When driving for more than 30 minutes she gets pain in her legs. Taking pregabalin for nerve pain.  Suspected sleep apnea Was having some low O2 stats when in the hospital for her recent back surgery and is concerned about sleep apnea. She does snore and has daytime fatigue.   Health Maintenance: Immunizations -- UTD on flu, tetanus vaccines. Colonoscopy -- N/A Mammogram -- N/A PAP -- Last completed 12/30/17. Negative for intraepithelial lesions or malignancy. Recommended repeat in 2024. Bone Density -- N/A Diet -- Tries to eat healthy. Exercise -- Does not exercise.  Sleep habits -- Has been having some sleep trouble. Mood -- Stable  UTD with dentist? - Not UTD UTD with eye doctor? - Not UTD  Weight history: Wt Readings from Last 10 Encounters:  10/15/22 (!) 319 lb 8 oz (144.9 kg)  08/26/22 (!) 307 lb (139.3 kg)  05/26/22 (!) 309 lb 8 oz (140.4 kg)  04/01/22 298 lb (135.2 kg)  02/03/22 295 lb 3.2 oz (133.9 kg)  01/21/22 289 lb (131.1 kg)  01/08/22 284 lb (128.8 kg)   01/07/22 286 lb (129.7 kg)  01/04/22 288 lb 2.3 oz (130.7 kg)  12/23/21 288 lb 4 oz (130.7 kg)   Body mass index is 47.18 kg/m. Patient's last menstrual period was 10/14/2022 (exact date).  Alcohol use:  reports current alcohol use of about 1.0 - 2.0 standard drink of alcohol per week.  Tobacco use:  Tobacco Use: Medium Risk (10/15/2022)   Patient History    Smoking Tobacco Use: Former    Smokeless Tobacco Use: Never    Passive Exposure: Not on file   Eligible for lung cancer screening? No     10/15/2022    2:48 PM  Depression screen PHQ 2/9  Decreased Interest 0  Down, Depressed, Hopeless 0  PHQ - 2 Score 0  Altered sleeping 1  Tired, decreased energy 1  Change in appetite 1  Feeling bad or failure about yourself  0  Trouble concentrating 0  Moving slowly or fidgety/restless 0  Suicidal thoughts 0  PHQ-9 Score 3  Difficult doing work/chores Not difficult at all     Other providers/specialists: Patient Care Team: [name], [place] as PCP - General (Physician Assistant) Romualdo Bolk, MD as Consulting Physician (Obstetrics and Gynecology)    PMHx, SurgHx, SocialHx, Medications, and Allergies were reviewed in the Visit Navigator and updated as appropriate.   Past Medical History:  Diagnosis Date   Abnormal Pap smear of cervix ~2005   colpo biopsy   Allergy    Latex allergy   Anxiety    Depression    Hemorrhage after  delivery of fetus 2011   Hyperlipidemia 03/12/2021   SVD (spontaneous vaginal delivery)    x 2     Past Surgical History:  Procedure Laterality Date   APPENDECTOMY  2003   BREAST SURGERY Bilateral 11/14/2002   Breast Reduction    COLPOSCOPY  ~2005   Normal - per pt   KNEE SURGERY     left   LAPAROSCOPIC BILATERAL SALPINGECTOMY N/A 02/28/2016   Procedure: LAPAROSCOPIC BILATERAL SALPINGECTOMY;  Surgeon: Romualdo Bolk, MD;  Location: [facility];  Service: Gynecology;  Laterality: N/A;  Beth RNFA   TUBAL LIGATION     WISDOM TOOTH  EXTRACTION       Family History  Adopted: Yes  Problem Relation Age of Onset   Stroke Maternal Grandmother    Heart disease Maternal Grandmother     Social History   Tobacco Use   Smoking status: Former    Packs/day: 0.25    Years: 7.00    Additional pack years: 0.00    Total pack years: 1.75    Types: Cigarettes    Quit date: 07/26/2014    Years since quitting: 8.2   Smokeless tobacco: Never  Vaping Use   Vaping Use: Never used  Substance Use Topics   Alcohol use: Yes    Alcohol/week: 1.0 - 2.0 standard drink of alcohol    Types: 1 - 2 Glasses of wine per week   Drug use: No    Review of Systems:   Review of Systems  Constitutional:  Negative for chills, fever, malaise/fatigue and weight loss.  HENT:  Negative for hearing loss, sinus pain and sore throat.   Respiratory:  Negative for cough, hemoptysis and shortness of breath.   Cardiovascular:  Negative for chest pain, palpitations, leg swelling and PND.  Gastrointestinal:  Negative for abdominal pain, constipation, diarrhea, heartburn, nausea and vomiting.  Genitourinary:  Negative for dysuria, frequency and urgency.  Musculoskeletal:  Positive for back pain. Negative for myalgias and neck pain.  Skin:  Negative for itching and rash.  Neurological:  Negative for dizziness, tingling, seizures and headaches.       (-) Hemiparesis  Endo/Heme/Allergies:  Negative for polydipsia.  Psychiatric/Behavioral:  Negative for depression. The patient is not nervous/anxious.     Objective:   BP 110/70 (BP Location: Left Arm, Patient Position: Sitting, Cuff Size: Large)   Pulse (!) 106   Temp (!) 97.5 F (36.4 C) (Temporal)   Ht 5\' 9"  (1.753 m)   Wt (!) 319 lb 8 oz (144.9 kg)   LMP 10/14/2022 (Exact Date)   SpO2 98%   BMI 47.18 kg/m  Body mass index is 47.18 kg/m.   General Appearance:    Alert, cooperative, no distress, appears stated age  Head:    Normocephalic, without obvious abnormality, atraumatic  Eyes:     PERRL, conjunctiva/corneas clear, EOM's intact, fundi    benign, both eyes  Ears:    Normal TM's and external ear canals, both ears  Nose:   Nares normal, septum midline, mucosa normal, no drainage    or sinus tenderness  Throat:   Lips, mucosa, and tongue normal; teeth and gums normal  Neck:   Supple, symmetrical, trachea midline, no adenopathy;    thyroid:  no enlargement/tenderness/nodules; no carotid   bruit or JVD  Back:     Symmetric, no curvature, ROM normal, no CVA tenderness  Lungs:     Clear to auscultation bilaterally, respirations unlabored  Chest Wall:    No tenderness  or deformity   Heart:    Regular rate and rhythm, S1 and S2 normal, no murmur, rub or gallop  Breast Exam:    Deferred  Abdomen:     Soft, non-tender, bowel sounds active all four quadrants,    no masses, no organomegaly  Genitalia:    Deferred  Extremities:   Extremities normal, atraumatic, no cyanosis  Bilateral lower extremity edema in feet; no calf tenderness/swelling/erythema  Pulses:   2+ and symmetric all extremities  Skin:   Skin color, texture, turgor normal, no rashes or lesions  Lymph nodes:   Cervical, supraclavicular, and axillary nodes normal  Neurologic:   CNII-XII intact, normal strength, sensation and reflexes    throughout    Assessment/Plan:   Routine physical examination Today patient counseled on age appropriate routine health concerns for screening and prevention, each reviewed and up to date or declined. Immunizations reviewed and up to date or declined. Labs ordered and reviewed. Risk factors for depression reviewed and negative. Hearing function and visual acuity are intact. ADLs screened and addressed as needed. Functional ability and level of safety reviewed and appropriate. Education, counseling and referrals performed based on assessed risks today. Patient provided with a copy of personalized plan for preventive services.  Bilateral swelling of feet No red flags on exam  suspect  dependent edema Update blood work today for further evaluation We will likely trial Lasix 20 mg as needed If symptoms do not improve or if they worsen will obtain further workup  Pars defect with spondylolisthesis Discussed Management per neurosurgery  Obesity, unspecified classification, unspecified obesity type, unspecified whether serious comorbidity present Continue efforts on healthy lifestyle including  Suspected sleep apnea Referral to sleep medicine    I,Alexander Ruley,acting as a scribe for [name], PA.,have documented all relevant documentation on the behalf of [name], PA,as directed by  [name], PA while in the presence of [name], [place].  I, [name], [place], have reviewed all documentation for this visit. The documentation on 10/15/22 for the exam, diagnosis, procedures, and orders are all accurate and complete.   [name], PA-C [facility]

## 2022-10-15 ENCOUNTER — Ambulatory Visit (INDEPENDENT_AMBULATORY_CARE_PROVIDER_SITE_OTHER): Payer: 59 | Admitting: Physician Assistant

## 2022-10-15 ENCOUNTER — Encounter: Payer: Self-pay | Admitting: Physician Assistant

## 2022-10-15 VITALS — BP 110/70 | HR 106 | Temp 97.5°F | Ht 69.0 in | Wt 319.5 lb

## 2022-10-15 DIAGNOSIS — M7989 Other specified soft tissue disorders: Secondary | ICD-10-CM

## 2022-10-15 DIAGNOSIS — M4310 Spondylolisthesis, site unspecified: Secondary | ICD-10-CM | POA: Diagnosis not present

## 2022-10-15 DIAGNOSIS — E669 Obesity, unspecified: Secondary | ICD-10-CM | POA: Diagnosis not present

## 2022-10-15 DIAGNOSIS — Z0000 Encounter for general adult medical examination without abnormal findings: Secondary | ICD-10-CM | POA: Diagnosis not present

## 2022-10-15 DIAGNOSIS — R29818 Other symptoms and signs involving the nervous system: Secondary | ICD-10-CM

## 2022-10-15 MED ORDER — FUROSEMIDE 20 MG PO TABS: 20.0000 mg | ORAL_TABLET | Freq: Every day | ORAL | 3 refills | Status: AC | PRN

## 2022-10-15 MED ORDER — ALPRAZOLAM 0.25 MG PO TABS: 0.2500 mg | ORAL_TABLET | Freq: Two times a day (BID) | ORAL | 1 refills | Status: DC | PRN

## 2022-10-15 NOTE — Patient Instructions (Addendum)
It was great to see you!  I will be in touch to give you the ok to start lasix for your fluid  Please go to the lab for blood work.   Our office will call you with your results unless you have chosen to receive results via MyChart.  If your blood work is normal we will follow-up each year for physicals and as scheduled for chronic medical problems.  If anything is abnormal we will treat accordingly and get you in for a follow-up.  Take care,  Amanda Davis

## 2022-10-16 LAB — CBC WITH DIFFERENTIAL/PLATELET
Basophils Absolute: 0.1 10*3/uL (ref 0.0–0.1)
Basophils Relative: 1.1 % (ref 0.0–3.0)
Eosinophils Absolute: 0.4 10*3/uL (ref 0.0–0.7)
Eosinophils Relative: 4.5 % (ref 0.0–5.0)
HCT: 36.1 % (ref 36.0–46.0)
Hemoglobin: 12 g/dL (ref 12.0–15.0)
Lymphocytes Relative: 23.9 % (ref 12.0–46.0)
Lymphs Abs: 2.3 10*3/uL (ref 0.7–4.0)
MCHC: 33.2 g/dL (ref 30.0–36.0)
MCV: 86.4 fl (ref 78.0–100.0)
Monocytes Absolute: 0.6 10*3/uL (ref 0.1–1.0)
Monocytes Relative: 6.6 % (ref 3.0–12.0)
Neutro Abs: 6.2 10*3/uL (ref 1.4–7.7)
Neutrophils Relative %: 63.9 % (ref 43.0–77.0)
Platelets: 370 10*3/uL (ref 150.0–400.0)
RBC: 4.18 Mil/uL (ref 3.87–5.11)
RDW: 14.4 % (ref 11.5–15.5)
WBC: 9.7 10*3/uL (ref 4.0–10.5)

## 2022-10-16 LAB — COMPREHENSIVE METABOLIC PANEL
ALT: 23 U/L (ref 0–35)
AST: 18 U/L (ref 0–37)
Albumin: 4.2 g/dL (ref 3.5–5.2)
Alkaline Phosphatase: 61 U/L (ref 39–117)
BUN: 8 mg/dL (ref 6–23)
CO2: 27 mEq/L (ref 19–32)
Calcium: 9.2 mg/dL (ref 8.4–10.5)
Chloride: 103 mEq/L (ref 96–112)
Creatinine, Ser: 0.67 mg/dL (ref 0.40–1.20)
GFR: 110.81 mL/min (ref >60.00)
Glucose, Bld: 90 mg/dL (ref 70–99)
Potassium: 4.1 mEq/L (ref 3.5–5.1)
Sodium: 139 mEq/L (ref 135–145)
Total Bilirubin: 0.4 mg/dL (ref 0.2–1.2)
Total Protein: 7.1 g/dL (ref 6.0–8.3)

## 2022-10-16 LAB — LIPID PANEL
Cholesterol: 192 mg/dL (ref 0–200)
HDL: 42.1 mg/dL (ref >39.00)
LDL Cholesterol: 110 mg/dL — ABNORMAL HIGH (ref 0–99)
NonHDL: 149.74
Total CHOL/HDL Ratio: 5
Triglycerides: 198 mg/dL — ABNORMAL HIGH (ref 0.0–149.0)
VLDL: 39.6 mg/dL (ref 0.0–40.0)

## 2022-10-16 LAB — IBC + FERRITIN
Ferritin: 28.7 ng/mL (ref 10.0–291.0)
Iron: 31 ug/dL — ABNORMAL LOW (ref 42–145)
Saturation Ratios: 8.9 % — ABNORMAL LOW (ref 20.0–50.0)
TIBC: 350 ug/dL (ref 250.0–450.0)
Transferrin: 250 mg/dL (ref 212.0–360.0)

## 2022-10-16 LAB — HEMOGLOBIN A1C: Hgb A1c MFr Bld: 5.7 % (ref 4.6–6.5)

## 2022-10-16 LAB — VITAMIN B12: Vitamin B-12: 338 pg/mL (ref 211–911)

## 2022-10-16 LAB — TSH: TSH: 1.18 u[IU]/mL (ref 0.35–5.50)

## 2022-11-26 ENCOUNTER — Encounter: Payer: Self-pay | Admitting: Neurology

## 2022-11-26 ENCOUNTER — Ambulatory Visit (INDEPENDENT_AMBULATORY_CARE_PROVIDER_SITE_OTHER): Payer: 59 | Admitting: Neurology

## 2022-11-26 VITALS — BP 136/88 | HR 89 | Ht 68.0 in | Wt 318.2 lb

## 2022-11-26 DIAGNOSIS — Z6841 Body Mass Index (BMI) 40.0 and over, adult: Secondary | ICD-10-CM | POA: Diagnosis not present

## 2022-11-26 DIAGNOSIS — E662 Morbid (severe) obesity with alveolar hypoventilation: Secondary | ICD-10-CM

## 2022-11-26 DIAGNOSIS — G4733 Obstructive sleep apnea (adult) (pediatric): Secondary | ICD-10-CM

## 2022-11-26 DIAGNOSIS — G4734 Idiopathic sleep related nonobstructive alveolar hypoventilation: Secondary | ICD-10-CM

## 2022-11-26 DIAGNOSIS — R0683 Snoring: Secondary | ICD-10-CM

## 2022-11-26 NOTE — Patient Instructions (Signed)
ASSESSMENT AND PLAN 37 y.o. year old female  here with:   0) non restorative , dreamless sleep with high fragmentation. Poor sleep quality.   1)  in hospital witnessed hypoxia during sleep after anesthesia event, has had a pneumonia within 4 month  prior to the observed hypoxia.   2)  OSA ? snoring, witnessed apnea. Patient's risk factors include large neck, high BMI and retrognathia.   3)  Some insomnia , some nights, prn  xanax by PCP.    plan is to obtain a HST and screen for OSA.  Weight loss is the main goal to reduce risk factor of morbid obesity.   I plan to follow up either personally or through our NP within 3-5 months.

## 2022-11-26 NOTE — Progress Notes (Signed)
SLEEP MEDICINE CLINIC    Provider:  Melvyn Novas, MD  Primary Care Physician:  [name], [place] [address] [place] [address]     Referring Provider: [name], [place] [address] [place],  [place] [address]          Chief Complaint according to patient   Patient presents with:     New Patient (Initial Visit)     NEW SLEEP CONSULT Patient in room #1 with her daughter Zachery Dauer. Patient states she feels as if not getting enough sleep, many sleep arousals, recent surgery and hospital noted oxygen levels would drop and that she snores.       HISTORY OF PRESENT ILLNESS:  ZAKEYA BOUCH is a 37 y.o. female patient who is seen upon referral of her PCP on 11/26/2022 following [facility]. There she was recorded with low oxygen saturations, snoring and  apnea.  She has suffered from Pneumonia last Decemebr 2023. See notes form PCP , 05-21-2022. Patient used to wear braces and retainers. Obesity- weight has been a problem in young adulthood, lost weight 14 years ago and gained it back.   Chief concern according to patient :  Here to be checked for apnea.    I have the pleasure of seeing DIMITRA MALAFRONTE 11/26/22 a right -handed female with a possible sleep disorder.  Family medical /sleep history: no other family member on CPAP with OSA, she was adopted.    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 Tobacco use; quit smoking in 12 years ago, between pregnancies. ETOH use; not exceeding 4/ months.  Caffeine intake in form of Coffee( rare) Soda( /) Tea ( clack tea , 3 glasses day ) yes to energy drinks 1 eery other day, monster .  Exercise in form of walking .   Sleep habits are as follows: The patient's dinner time is between 4.30-8  PM. Depends on children's out of school activity- The patient goes to bed at 10-12  PM and continues to sleep for intervals 1-2 hours- does not wake for  bathroom breaks. Bedroom is cool, and quiet and dark.  The preferred sleep position is right lateral, , with the support of 1 pillow on an adjusted bed. . Dreams are reportedly rare. The patient wakes up with an alarm  at 5.50- . 6  AM is the usual rise time.  She reports not feeling refreshed or restored in AM, with symptoms such as dry mouth, no morning headache.  Naps are taken infrequently, lasting from 10 to 20 minutes and are no  more refreshing than nocturnal sleep.    Review of Systems: Out of a complete 14 system review, the patient complains of only the following symptoms, and all other reviewed systems are negative.:  Fatigue, sleepiness , snoring, fragmented sleep   How likely are you to doze in the following situations: 0 = not likely, 1 = slight chance, 2 = moderate chance, 3 = high chance   Sitting and Reading? Watching Television? Sitting inactive in a public place (theater or meeting)? As a passenger in a car for an hour without a break? Lying down in the afternoon when circumstances permit? Sitting and talking to someone? Sitting quietly after lunch without alcohol? In a car, while stopped for a few minutes in traffic?  Total = 7/ 24 points   FSS endorsed at 20/ 63 points.   Social History   Socioeconomic History   Marital status: Divorced    Spouse name: Lives with boy friend.   Number of children: 2   Years of education: Not on file   Highest education level: Not on file  Occupational History   Not on file  Tobacco Use   Smoking status: Former    Packs/day: 0.25    Years: 7.00    Additional pack years: 0.00    Total pack years: 1.75    Types: Cigarettes    Quit date: 07/26/2014    Years since quitting: 8.3   Smokeless tobacco: Never  Vaping Use   Vaping Use: Never used  Substance and Sexual Activity   Alcohol use: Yes    Alcohol/week: 1.0 - 2.0 standard drink of alcohol    Types:  1 - Glass of wine per week   Drug use: No   Sexual activity: Yes    Birth control/protection: Surgical    Comment: tubal ligation  Other Topics Concern   Not on file  Social History Narrative   Single mom   8 and 22 yo children   Work for [employer]    Landscape architect in [employer]      Social Determinants of Health   Financial Resource Strain: Not on file  Food Insecurity: Not on file  Transportation Needs: Not on file  Physical Activity: Not on file  Stress: Not on file  Social Connections: Not on file    Family History  Adopted: Yes  Problem Relation Age of Onset   Stroke Maternal Grandmother    Heart disease Maternal Grandmother     Past Medical History:  Diagnosis Date   Abnormal Pap smear of cervix ~2005   colpo biopsy   Allergy    Latex allergy   Anxiety    Depression    Hemorrhage after delivery of fetus 2011   Hyperlipidemia 03/12/2021   SVD (spontaneous vaginal delivery)    x 2    Past Surgical History:  Procedure Laterality Date   APPENDECTOMY  2003   BREAST SURGERY Bilateral 11/14/2002   Breast Reduction    COLPOSCOPY  ~2005   Normal - per pt   KNEE SURGERY     left   LAPAROSCOPIC BILATERAL SALPINGECTOMY N/A 02/28/2016   Procedure: LAPAROSCOPIC BILATERAL SALPINGECTOMY;  Surgeon: Romualdo Bolk, MD;  Location: [facility];  Service: Gynecology;  Laterality: N/A;  Beth RNFA   TUBAL LIGATION     WISDOM TOOTH EXTRACTION       Current Outpatient Medications on File Prior to Visit  Medication Sig Dispense Refill   ALPRAZolam (XANAX) 0.25 MG tablet Take 1 tablet (0.25 mg total) by mouth 2 (two) times daily as needed for anxiety. 30 tablet 1   aspirin-acetaminophen-caffeine (EXCEDRIN MIGRAINE) 250-250-65 MG tablet Take 2 tablets by mouth every 6 (six) hours as needed for headache.     furosemide (LASIX) 20 MG tablet Take 1 tablet (20 mg total) by mouth daily as needed for fluid. 30 tablet 3   magnesium (MAGTAB) 84 MG ( ) TBCR SR tablet  Take 250 mg by mouth.     methocarbamol (ROBAXIN) 500 MG tablet Take 500 mg by mouth at bedtime.     Multiple Vitamin (MULTIVITAMIN) tablet Take 1 tablet by mouth at bedtime.      pregabalin (LYRICA) 75 MG capsule Take 75 mg by mouth 2 (two) times daily.  SENNA S 8.6-50 MG tablet Take 2 tablets by mouth at bedtime.     vitamin B-12 (CYANOCOBALAMIN) 500 MCG tablet Take 500 mcg by mouth daily.     No current facility-administered medications on file prior to visit.    Allergies  Allergen Reactions   Tape Hives   Gabapentin Other (See Comments)    Headaches   Latex Rash     DIAGNOSTIC DATA (LABS, IMAGING, TESTING) - I reviewed patient records, labs, notes, testing and imaging myself where available.  Lab Results  Component Value Date   WBC 9.7 10/15/2022   HGB 12.0 10/15/2022   HCT 36.1 10/15/2022   MCV 86.4 10/15/2022   PLT 370.0 10/15/2022      Component Value Date/Time   NA 139 10/15/2022 1519   NA 139 01/17/2020 0913   K 4.1 10/15/2022 1519   CL 103 10/15/2022 1519   CO2 27 10/15/2022 1519   GLUCOSE 90 10/15/2022 1519   BUN 8 10/15/2022 1519   BUN 8 01/17/2020 0913   CREATININE 0.67 10/15/2022 1519   CREATININE 0.69 01/21/2022 1639   CALCIUM 9.2 10/15/2022 1519   PROT 7.1 10/15/2022 1519   PROT 7.0 01/17/2020 0913   ALBUMIN 4.2 10/15/2022 1519   ALBUMIN 4.4 01/17/2020 0913   AST 18 10/15/2022 1519   ALT 23 10/15/2022 1519   ALKPHOS 61 10/15/2022 1519   BILITOT 0.4 10/15/2022 1519   BILITOT 0.4 01/17/2020 0913   GFRNONAA 117 01/17/2020 0913   GFRAA 135 01/17/2020 0913   Lab Results  Component Value Date   CHOL 192 10/15/2022   HDL 42.10 10/15/2022   LDLCALC 110 (H) 10/15/2022   TRIG 198.0 (H) 10/15/2022   CHOLHDL 5 10/15/2022   Lab Results  Component Value Date   HGBA1C 5.7 10/15/2022   Lab Results  Component Value Date   VITAMINB12 338 10/15/2022   Lab Results  Component Value Date   TSH 1.18 10/15/2022    PHYSICAL EXAM:  Today's Vitals    11/26/22 1512  BP: 136/88  Pulse: 89  Weight: (!) 318 lb 3.2 oz (144.3 kg)  Height: 5\' 8"  (1.727 m)   Body mass index is 48.38 kg/m.   Wt Readings from Last 3 Encounters:  11/26/22 (!) 318 lb 3.2 oz (144.3 kg)  10/15/22 (!) 319 lb 8 oz (144.9 kg)  08/26/22 (!) 307 lb (139.3 kg)     Ht Readings from Last 3 Encounters:  11/26/22 5\' 8"  (1.727 m)  10/15/22 5\' 9"  (1.753 m)  05/26/22 5\' 9"  (1.753 m)      General: The patient is awake, alert and appears not in acute distress. The patient is well groomed. Head: Normocephalic, atraumatic. Neck is supple. Mallampati 3,  neck circumference:17 inches . Nasal airflow  patent.  Retrognathia is seen.  Dental status: overbite,biological teeth, small and crowded  Cardiovascular:  Regular rate and cardiac rhythm by pulse,  without distended neck veins. Respiratory: Lungs are clear to auscultation.  Skin:  Without evidence of ankle edema, or rash. Trunk: The patient's posture is erect.   NEUROLOGIC EXAM: The patient is awake and alert, oriented to place and time.   Memory subjective described as intact.  Attention span & concentration ability appears normal.  Speech is fluent,  without  dysarthria, dysphonia or aphasia.  Mood and affect are appropriate.   Cranial nerves: no loss of smell or taste reported  Pupils are equal and briskly reactive to light. Funduscopic exam deferred.  Extraocular movements in vertical  and horizontal planes were intact and without nystagmus. No Diplopia. Visual fields by finger perimetry are intact. Hearing was intact to soft voice and finger rubbing.    Facial sensation intact to fine touch.  Facial motor strength is symmetric and tongue and uvula move midline.  Neck ROM : rotation, tilt and flexion extension were normal for age and shoulder shrug was symmetrical.    Motor exam:  Symmetric bulk, tone and ROM.   Normal tone without cog -, symmetric grip strength .   Sensory:  Fine touch and vibration were  intact.  Proprioception tested in the upper extremities was normal.   Coordination: Rapid alternating movements in the fingers/hands were of normal speed.  The Finger-to-nose maneuver was intact without evidence of ataxia, dysmetria or tremor.   Gait and station: Patient could rise unassisted from a seated position, walked without assistive device.  Stance is of normal width/ base and the patient turned with 3 steps.  Toe and heel walk were deferred.  Deep tendon reflexes: in the  upper and lower extremities are symmetric and intact.  Babinski response was deferred .    ASSESSMENT AND PLAN 37 y.o. year old female  here with:   0) non restorative , dream less sleep with high fragmentation.   1) witnessed hypoxia in sleep after anesthesia event, has had a pneumonia within 4 month  prior to the observed hypoxia.   2)  OSA ? snoring, witnessed apnea. Patient's risk factors include large neck, high BMI and retrognathia.   3)  Some insomnia , some nights, prn  xanax by PCP.    plan is to obtain a HST and screen for OSA.  Weight loss is the main goal to reduce risk factor of morbid obesity.   I plan to follow up either personally or through our NP within 3-5 months.   I would like to thank [name], PA , for allowing me to meet with and to take care of this pleasant patient.    After spending a total time of  39  minutes face to face and additional time for physical and neurologic examination, review of laboratory studies,  personal review of imaging studies, reports and results of other testing and review of referral information / records as far as provided in visit,   Electronically signed by: Melvyn Novas, MD 11/26/2022 3:39 PM  [facility] Board certified by The [employer] of Sleep Medicine and Diplomate of the [employer] of Sleep Medicine. Board certified In Neurology through the ABPN, Fellow of the [employer] of  Neurology. Medical Director of [facility].

## 2022-12-10 ENCOUNTER — Ambulatory Visit (INDEPENDENT_AMBULATORY_CARE_PROVIDER_SITE_OTHER): Payer: 59 | Admitting: Neurology

## 2022-12-10 DIAGNOSIS — Z6841 Body Mass Index (BMI) 40.0 and over, adult: Secondary | ICD-10-CM | POA: Diagnosis not present

## 2022-12-10 DIAGNOSIS — G4733 Obstructive sleep apnea (adult) (pediatric): Secondary | ICD-10-CM | POA: Diagnosis not present

## 2022-12-10 DIAGNOSIS — E662 Morbid (severe) obesity with alveolar hypoventilation: Secondary | ICD-10-CM

## 2022-12-10 DIAGNOSIS — R0683 Snoring: Secondary | ICD-10-CM

## 2022-12-15 ENCOUNTER — Telehealth: Payer: Self-pay | Admitting: Neurology

## 2022-12-15 NOTE — Progress Notes (Signed)
[facility]   HOME SLEEP TEST REPORT ( by Watch PAT)   STUDY DATE:  12-14-2022 Amanda Davis 37 year old female 05-18-1986   ORDERING CLINICIAN: Melvyn Novas, MD  REFERRING CLINICIAN: Dr Amanda Davis, Amanda Davis.    CLINICAL INFORMATION/HISTORY: [facility] patient with status post spinal surgery  09-17-2022, here seen on 11-26-2022 for snoring, sleepiness and obesity.  This patient is seen here as a new sleep consultation and states that she feels her sleep has not enough restorative and refreshing quality she never feels she gets enough sleep.  She also had after her recent surgery been told that her oxygen levels at night would drop and that she was witnessed to snore and have apnea in the hospital.  Her hospitalization was in March 2024 at [facility] for a lumbar fusion.  Prior to that she had suffered from pneumonia in December 2023 -she is here to be checked for apnea.    Epworth sleepiness score: 7/24.  Fatigue severity score endorsed at 20 out of 63 points   BMI: 48.8 kg/m   Neck Circumference: 17 inches   FINDINGS:   Sleep Summary:   Total Recording Time (hours, min):     7 hours 55 minutes   Total Sleep Time (hours, min):    7 hours 12 minutes             Percent REM (%):     30%      Sleep latency was 5 minutes and REM sleep latency was 61 minutes.                               Respiratory Indices following AASM criteria:   Calculated pAHI (per hour):    13.9/h                         REM pAHI: 33.9/h                                               NREM pAHI:    5.5/h                          Positional AHI: The patient had the lowest AHI in supine sleep position at 5.6/h and the highest in prone position with an AHI of 25.2/h.  This is a highly unusual distribution.  Snoring reached a mean volume of 41 dB and was present for about 11% of total sleep time.                                                 Oxygen Saturation Statistics:  O2  Saturation Range (%):   Between a nadir at 85% of the maximal saturation of 98% with a mean saturation of 94%.                                    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  This sleep apnea represents a form of hypoventilation during REM sleep and is most often seen in patients on muscle relaxants, sedatives or pain medication and on patients with obesity and a high abdominal girth.  Weight loss is a long-term treatment goal for this patient but for immediate intervention and AUTO titration CPAP is the optimal therapy.   Patients with REM sleep dependent apnea do not respond well to dental devices or hypoglossal nerve stimulation devices.  I will order an auto titration CPAP with a setting between 5 and 16 cmH2O pressure 2 cm EPR heated humidification and mask of choice.   RECOMMENDATION: Weight loss is a long-term treatment goal for this patient but for immediate intervention and AUTO_+titration CPAP is the optimal therapy.  Patients with REM sleep dependent apnea do not respond well to dental devices or hypoglossal nerve stimulation devices.  I will order an auto titration CPAP with a setting between 5 and 16 cmH2O pressure 2 cm EPR heated humidification and mask of choice.    INTERPRETING PHYSICIAN:   Melvyn Novas, MD

## 2022-12-15 NOTE — Telephone Encounter (Signed)
Sent mychart message

## 2022-12-23 ENCOUNTER — Encounter: Payer: Self-pay | Admitting: Neurology

## 2022-12-23 DIAGNOSIS — G4733 Obstructive sleep apnea (adult) (pediatric): Secondary | ICD-10-CM | POA: Insufficient documentation

## 2022-12-23 DIAGNOSIS — R0683 Snoring: Secondary | ICD-10-CM | POA: Insufficient documentation

## 2022-12-23 HISTORY — DX: Obstructive sleep apnea (adult) (pediatric): G47.33

## 2022-12-23 NOTE — Addendum Note (Signed)
Addended by: Melvyn Novas on: 12/23/2022 07:12 PM   Modules accepted: Orders

## 2022-12-23 NOTE — Progress Notes (Signed)
    Weight loss is a long-term treatment goal for this patient but for immediate intervention the  AUTO titration CPAP is the optimal therapy.    Patients with REM sleep dependent apnea do not respond well to dental devices or hypoglossal nerve stimulation devices.  I will order an auto titration CPAP with a setting between 5 and 16 cmH2O pressure 2 cm EPR heated humidification and mask of choice. I ordered this through DME.

## 2022-12-23 NOTE — Procedures (Signed)
[facility]   HOME SLEEP TEST REPORT ( by Watch PAT)   STUDY DATE:  12-14-2022 Amanda Davis 37 year old female [birth year]   ORDERING CLINICIAN: Melvyn Novas, MD  REFERRING CLINICIAN: Dr Seymour Bars, [name].    CLINICAL INFORMATION/HISTORY: [facility] patient with status post spinal surgery  09-17-2022, here seen on 11-26-2022 for snoring, sleepiness and obesity.  This patient is seen here as a new sleep consultation and states that she feels her sleep has not enough restorative and refreshing quality she never feels she gets enough sleep.  She also had after her recent surgery been told that her oxygen levels at night would drop and that she was witnessed to snore and have apnea in the hospital.  Her hospitalization was in March 2024 at [facility] for a lumbar fusion.  Prior to that she had suffered from pneumonia in December 2023 -she is here to be checked for apnea.    Epworth sleepiness score: 7/24.  Fatigue severity score endorsed at 20 out of 63 points   BMI: 48.8 kg/m   Neck Circumference: 17 inches   FINDINGS:   Sleep Summary:   Total Recording Time (hours, min):     7 hours 55 minutes   Total Sleep Time (hours, min):    7 hours 12 minutes             Percent REM (%):     30%      Sleep latency was 5 minutes and REM sleep latency was 61 minutes.                               Respiratory Indices following AASM criteria:   Calculated pAHI (per hour):    13.9/h                         REM pAHI: 33.9/h                                               NREM pAHI:    5.5/h                          Positional AHI: The patient had the lowest AHI in supine sleep position at 5.6/h and the highest in prone position with an AHI of 25.2/h.  This is a highly unusual distribution.  Snoring reached a mean volume of 41 dB and was present for about 11% of total sleep time.                                                 Oxygen Saturation Statistics:  O2  Saturation Range (%):   Between a nadir at 85% of the maximal saturation of 98% with a mean saturation of 94%.                                    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  This sleep apnea represents a form of hypoventilation during REM sleep and is most often seen in patients on muscle relaxants, sedatives or pain medication and on patients with obesity and a high abdominal girth.  Weight loss is a long-term treatment goal for this patient but for immediate intervention and AUTO titration CPAP is the optimal therapy.   Patients with REM sleep dependent apnea do not respond well to dental devices or hypoglossal nerve stimulation devices.  I will order an auto titration CPAP with a setting between 5 and 16 cmH2O pressure 2 cm EPR heated humidification and mask of choice.   RECOMMENDATION: Weight loss is a long-term treatment goal for this patient but for immediate intervention and AUTO_+titration CPAP is the optimal therapy.  Patients with REM sleep dependent apnea do not respond well to dental devices or hypoglossal nerve stimulation devices.  I will order an auto titration CPAP with a setting between 5 and 16 cmH2O pressure 2 cm EPR heated humidification and mask of choice.    INTERPRETING PHYSICIAN:   Melvyn Novas, MD

## 2022-12-29 ENCOUNTER — Telehealth: Payer: Self-pay

## 2022-12-29 NOTE — Telephone Encounter (Signed)
I called pt. I advised pt that Dr. Vickey Huger reviewed their sleep study results and found that pt has OSA. Dr. Vickey Huger recommends that pt begin auto titration CPAP. I reviewed PAP compliance expectations with the pt. Pt is agreeable to starting a CPAP. I advised pt that an order will be sent to a DME, [facility], and [employer] will call the pt within about one week after they file with the pt's insurance. [employer] will show the pt how to use the machine, fit for masks, and troubleshoot the CPAP if needed. A follow up appt was made for insurance purposes with Ihor Austin on 03/30/23. Pt verbalized understanding of results. Pt had no questions at this time but was encouraged to call back if questions arise. I have sent the order to [employer].

## 2022-12-29 NOTE — Telephone Encounter (Signed)
-----   Message from Dante Dohmeier sent at 12/23/2022  7:12 PM EDT -----    Weight loss is a long-term treatment goal for this patient but for immediate intervention the  AUTO titration CPAP is the optimal therapy.    Patients with REM sleep dependent apnea do not respond well to dental devices or hypoglossal nerve stimulation devices.  I will order an auto titration CPAP with a setting between 5 and 16 cmH2O pressure 2 cm EPR heated humidification and mask of choice. I ordered this through DME.

## 2023-01-26 ENCOUNTER — Ambulatory Visit: Payer: 59 | Admitting: Obstetrics and Gynecology

## 2023-03-20 ENCOUNTER — Encounter: Payer: 59 | Admitting: Radiology

## 2023-03-30 ENCOUNTER — Ambulatory Visit (INDEPENDENT_AMBULATORY_CARE_PROVIDER_SITE_OTHER): Payer: 59 | Admitting: Adult Health

## 2023-03-30 ENCOUNTER — Encounter: Payer: Self-pay | Admitting: Adult Health

## 2023-03-30 VITALS — BP 143/86 | HR 89 | Ht 69.0 in | Wt 333.0 lb

## 2023-03-30 DIAGNOSIS — G4733 Obstructive sleep apnea (adult) (pediatric): Secondary | ICD-10-CM

## 2023-03-30 NOTE — Patient Instructions (Addendum)
Your Plan:  Continue nightly use of CPAP with ensuring greater than 4 hours nightly for optimal benefit and per insurance requirements  Continue to follow with your DME company [employer] [facility] for any needed supplies or CPAP related concerns     Follow-up in 6 months or call earlier if needed     Thank you for coming to see [name] at [facility]. I hope we have been able to provide you high quality care today.  You may receive a patient satisfaction survey over the next few weeks. We would appreciate your feedback and comments so that we may continue to improve ourselves and the health of our patients.

## 2023-03-30 NOTE — Progress Notes (Signed)
[facility] [address] [place]. [place] [address] [phone]       OFFICE FOLLOW UP NOTE  [name] Date of Birth:  28-Jan-1986 Medical Record Number:  [id number]    Primary neurologist: Dr. Vickey Huger Reason for visit: Initial CPAP follow-up    SUBJECTIVE:   CHIEF COMPLAINT:  Chief Complaint  Patient presents with   Follow-up    RM 3, here daughter Fredric Mare  Pt is here for initial CPAP. Pt states she has been doing well, states is taking a little while to get use to the mask, states is averaging at least 4 hours of use at night.     Follow-up visit:  Prior visit:  Brief HPI:   Amanda Davis is a 37 y.o. female who was initially evaluated by Dr. Vickey Huger on 11/26/2022 due to concern of underlying sleep apnea with reporting low oxygen saturations, snoring and apnea during hospitalization in 08/2022 s/p lumbar fusion.  ESS 7/24.  HST 12/10/2022 showed mild sleep apnea with total AHI 13.9/h with very strong REM sleep dependency with REM AHI 33.9/h.  AutoPap initiated 01/09/2023. DME [facility].     Interval history:  Past 30 days CPAP compliance report shows satisfactory usage and optimal residual AHI.  Reports some difficulty tolerating but has been gradually improving over the past month, at times can feel claustrophobic with FFM, was told she is not a candidate for nasal pillow masks as she is a mouth breather. Feels like over the past month, she has been able to wear longer and getting longer duration of sleep. She has not noticed any significant improvement of daytime energy levels although ESS today 3/24, prior to CPAP 7/24. FSS today 26/63, prior to CPAP 20/63.  No further questions or concerns at this time.           ROS:   14 system review of systems performed and negative with exception of those listed in HPI  PMH:  Past Medical History:  Diagnosis Date   Abnormal Pap smear of cervix ~2005   colpo biopsy   Allergy     Latex allergy   Anxiety    Chronic intermittent hypoxia with obstructive sleep apnea 12/23/2022   Depression    Hemorrhage after delivery of fetus 2011   Hyperlipidemia 03/12/2021   SVD (spontaneous vaginal delivery)    x 2    PSH:  Past Surgical History:  Procedure Laterality Date   APPENDECTOMY  2003   BREAST SURGERY Bilateral 11/14/2002   Breast Reduction    COLPOSCOPY  ~2005   Normal - per pt   KNEE SURGERY     left   LAPAROSCOPIC BILATERAL SALPINGECTOMY N/A 02/28/2016   Procedure: LAPAROSCOPIC BILATERAL SALPINGECTOMY;  Surgeon: Romualdo Bolk, MD;  Location: [facility];  Service: Gynecology;  Laterality: N/A;  Beth RNFA   TUBAL LIGATION     WISDOM TOOTH EXTRACTION      Social History:  Social History   Socioeconomic History   Marital status: Divorced    Spouse name: Not on file   Number of children: Not on file   Years of education: Not on file   Highest education level: Not on file  Occupational History   Not on file  Tobacco Use   Smoking status: Former    Current packs/day: 0.00    Average packs/day: 0.3 packs/day for 7.0 years (1.8 ttl pk-yrs)    Types: Cigarettes    Start date: 07/27/2007    Quit date:  07/26/2014    Years since quitting: 8.6   Smokeless tobacco: Never  Vaping Use   Vaping status: Never Used  Substance and Sexual Activity   Alcohol use: Yes    Alcohol/week: 1.0 - 2.0 standard drink of alcohol    Types: 1 - 2 Glasses of wine per week   Drug use: No   Sexual activity: Yes    Birth control/protection: Surgical    Comment: tubal ligation  Other Topics Concern   Not on file  Social History Narrative   Single mom   8 and 3 yo children   Work for [employer]    Landscape architect in [employer]      Social Determinants of Corporate investment banker Strain: Not on file  Food Insecurity: Low Risk  (08/12/2022)   [facility]   Hunger Vital Sign    Worried About Running Out of Food in the Last  Year: Never true    Ran Out of Food in the Last Year: Never true  Transportation Needs: Not on file (08/12/2022)  Physical Activity: Not on file  Stress: Not on file  Social Connections: Not on file  Intimate Partner Violence: Not on file    Family History:  Family History  Adopted: Yes  Problem Relation Age of Onset   Stroke Maternal Grandmother 45   Heart disease Maternal Grandmother     Medications:   Current Outpatient Medications on File Prior to Visit  Medication Sig Dispense Refill   ALPRAZolam (XANAX) 0.25 MG tablet Take 1 tablet (0.25 mg total) by mouth 2 (two) times daily as needed for anxiety. 30 tablet 1   aspirin-acetaminophen-caffeine (EXCEDRIN MIGRAINE) 250-250-65 MG tablet Take 2 tablets by mouth every 6 (six) hours as needed for headache.     furosemide (LASIX) 20 MG tablet Take 1 tablet (20 mg total) by mouth daily as needed for fluid. 30 tablet 3   magnesium (MAGTAB) 84 MG ( ) TBCR SR tablet Take 250 mg by mouth.     Multiple Vitamin (MULTIVITAMIN) tablet Take 1 tablet by mouth at bedtime.      pregabalin (LYRICA) 75 MG capsule Take 75 mg by mouth 2 (two) times daily.     SENNA S 8.6-50 MG tablet Take 2 tablets by mouth at bedtime.     vitamin B-12 (CYANOCOBALAMIN) 500 MCG tablet Take 500 mcg by mouth daily.     No current facility-administered medications on file prior to visit.    Allergies:   Allergies  Allergen Reactions   Tape Hives   Gabapentin Other (See Comments)    Headaches   Latex Rash      OBJECTIVE:  Physical Exam  Vitals:   03/30/23 1501  BP: (!) 143/86  Pulse: 89  Weight: (!) 333 lb (151 kg)  Height: 5\' 9"  (1.753 m)   Body mass index is 49.18 kg/m. No results found.  General: well developed, well nourished, very pleasant middle-age Caucasian female, seated, in no evident distress  Neurologic Exam Mental Status: Awake and fully alert. Oriented to place and time. Recent and remote memory intact. Attention span,  concentration and fund of knowledge appropriate. Mood and affect appropriate.  Cranial Nerves: Pupils equal, briskly reactive to light. Extraocular movements full without nystagmus. Visual fields full to confrontation. Hearing intact. Facial sensation intact. Face, tongue, palate moves normally and symmetrically.  Motor: Normal bulk and tone. Normal strength in all tested extremity muscles Gait and Station: Arises from chair without difficulty.  Stance is normal. Gait demonstrates normal stride length and balance without use of AD. Tandem walk and heel toe without difficulty.  Reflexes: 1+ and symmetric. Toes downgoing.         ASSESSMENT/PLAN: Amanda Davis is a 37 y.o. year old female    OSA on CPAP : Compliance report shows satisfactory usage with optimal residual AHI.  Continue current pressure settings.  Discussed ways to help improve tolerance, can consider trial of nasal pillow with use of chin strap if continues to have difficulty tolerating FFM d/t feeling claustrophobic.  Discussed continued nightly usage with ensuring greater than 4 hours nightly for optimal benefit and per insurance purposes.  Continue to follow with DME company for any needed supplies or CPAP related concerns     Follow up in 6 months via MyChart VV or call earlier if needed   CC:  PCP: [name], PA    I spent 25 minutes of face-to-face and non-face-to-face time with patient.  This included previsit chart review, lab review, study review, order entry, electronic health record documentation, patient education and discussion regarding above diagnoses and treatment plan and answered all other questions to patient's satisfaction  Ihor Austin, [facility]  [facility] [address] [place], [place] [address]  Phone [phone] Fax [phone] Note: This document was prepared with digital dictation and possible smart phrase technology. Any transcriptional  errors that result from this process are unintentional.

## 2023-04-02 ENCOUNTER — Telehealth: Payer: Self-pay

## 2023-04-08 NOTE — Progress Notes (Signed)
cancelled

## 2023-05-04 ENCOUNTER — Ambulatory Visit (INDEPENDENT_AMBULATORY_CARE_PROVIDER_SITE_OTHER): Payer: 59 | Admitting: Radiology

## 2023-05-04 ENCOUNTER — Other Ambulatory Visit (HOSPITAL_COMMUNITY)
Admission: RE | Admit: 2023-05-04 | Discharge: 2023-05-04 | Disposition: A | Discharge status: Home / Self Care | Payer: 59 | Source: Ambulatory Visit | Attending: Radiology | Admitting: Radiology

## 2023-05-04 ENCOUNTER — Encounter: Payer: Self-pay | Admitting: Radiology

## 2023-05-04 VITALS — BP 112/80 | HR 98 | Ht 67.75 in | Wt 334.0 lb

## 2023-05-04 DIAGNOSIS — Z9851 Tubal ligation status: Secondary | ICD-10-CM | POA: Diagnosis not present

## 2023-05-04 DIAGNOSIS — Z01419 Encounter for gynecological examination (general) (routine) without abnormal findings: Secondary | ICD-10-CM

## 2023-05-04 NOTE — Progress Notes (Signed)
Amanda Davis [birth year] [id number]   History:  37 y.o. G2P2 presents for annual exam. Would like fasting labs today.   Gynecologic History Patient's last menstrual period was 04/16/2023 (exact date). Period Duration (Days): 4-5 Period Pattern: Regular Menstrual Flow:  (varies) Menstrual Control: Maxi pad, Tampon Dysmenorrhea: None Contraception/Family planning: tubal ligation Sexually active: yes Last Pap: 2019. Results were: normal   Obstetric History OB History  Gravida Para Term Preterm AB Living  2 2 2  0 0 2  SAB IAB Ectopic Multiple Live Births  0 0 0   2    # Outcome Date GA Lbr Len/2nd Weight Sex Type Anes PTL Lv  2 Term 07/29/12    F Vag-Spont   LIV  1 Term 10/14/09    M Vag-Spont   LIV    The following portions of the patient's history were reviewed and updated as appropriate: allergies, current medications, past family history, past medical history, past social history, past surgical history, and problem list.  Review of Systems  All other systems reviewed and are negative.   Past medical history, past surgical history, family history and social history were all reviewed and documented in the EPIC chart.  Exam:  Vitals:   05/04/23 1558  BP: 112/80  Pulse: 98  SpO2: 98%  Weight: (!) 334 lb (151.5 kg)  Height: 5' 7.75" (1.721 m)   Body mass index is 51.16 kg/m.  Physical Exam Vitals and nursing note reviewed. Exam conducted with a chaperone present.  Constitutional:      Appearance: Normal appearance. She is obese.  HENT:     Head: Normocephalic and atraumatic.  Neck:     Thyroid: No thyroid mass, thyromegaly or thyroid tenderness.  Cardiovascular:     Rate and Rhythm: Regular rhythm.     Heart sounds: Normal heart sounds.  Pulmonary:     Effort: Pulmonary effort is normal.     Breath sounds: Normal breath sounds.  Chest:  Breasts:    Breasts are symmetrical.     Right: Normal. No inverted nipple, mass, nipple discharge, skin change  or tenderness.     Left: Normal. No inverted nipple, mass, nipple discharge, skin change or tenderness.  Abdominal:     General: Abdomen is flat. Bowel sounds are normal.     Palpations: Abdomen is soft.  Genitourinary:    General: Normal vulva.     Vagina: Normal. No vaginal discharge, bleeding or lesions.     Cervix: Normal. No discharge or lesion.     Uterus: Normal. Not enlarged and not tender.      Adnexa: Right adnexa normal and left adnexa normal.       Right: No mass, tenderness or fullness.         Left: No mass, tenderness or fullness.    Lymphadenopathy:     Upper Body:     Right upper body: No axillary adenopathy.     Left upper body: No axillary adenopathy.  Skin:    General: Skin is warm and dry.  Neurological:     Mental Status: She is alert and oriented to person, place, and time.  Psychiatric:        Mood and Affect: Mood normal.        Thought Content: Thought content normal.        Judgment: Judgment normal.      Cornelia Copa, CMA present for exam  Assessment/Plan:   1. Well woman exam with routine gynecological exam - Cytology -  PAP( [facility])   2. H/O tubal ligation  3. Morbid obesity (HCC) - HgB A1c - Lipid Profile - Comp Met (CMET) - CBC - Thyroid Panel With TSH     AEX 1 year  Arlie Solomons B WHNP-BC 4:05 PM 05/04/2023

## 2023-05-05 LAB — CBC
HCT: 39.1 % (ref 35.0–45.0)
Hemoglobin: 13.4 g/dL (ref 11.7–15.5)
MCH: 30.2 pg (ref 27.0–33.0)
MCHC: 34.3 g/dL (ref 32.0–36.0)
MCV: 88.3 fL (ref 80.0–100.0)
MPV: 9.9 fL (ref 7.5–12.5)
Platelets: 436 10*3/uL — ABNORMAL HIGH (ref 140–400)
RBC: 4.43 10*6/uL (ref 3.80–5.10)
RDW: 12.8 % (ref 11.0–15.0)
WBC: 14.1 10*3/uL — ABNORMAL HIGH (ref 3.8–10.8)

## 2023-05-05 LAB — COMPREHENSIVE METABOLIC PANEL
AG Ratio: 1.3 (calc) (ref 1.0–2.5)
ALT: 30 U/L — ABNORMAL HIGH (ref 6–29)
AST: 21 U/L (ref 10–30)
Albumin: 4.3 g/dL (ref 3.6–5.1)
Alkaline phosphatase (APISO): 85 U/L (ref 31–125)
BUN: 7 mg/dL (ref 7–25)
CO2: 26 mmol/L (ref 20–32)
Calcium: 9.5 mg/dL (ref 8.6–10.2)
Chloride: 101 mmol/L (ref 98–110)
Creat: 0.61 mg/dL (ref 0.50–0.97)
Globulin: 3.4 g/dL (ref 1.9–3.7)
Glucose, Bld: 90 mg/dL (ref 65–99)
Potassium: 3.9 mmol/L (ref 3.5–5.3)
Sodium: 138 mmol/L (ref 135–146)
Total Bilirubin: 0.6 mg/dL (ref 0.2–1.2)
Total Protein: 7.7 g/dL (ref 6.1–8.1)

## 2023-05-05 LAB — HEMOGLOBIN A1C
Hgb A1c MFr Bld: 5.8 %{Hb} — ABNORMAL HIGH (ref <5.7)
Mean Plasma Glucose: 120 mg/dL
eAG (mmol/L): 6.6 mmol/L

## 2023-05-05 LAB — LIPID PANEL
Cholesterol: 225 mg/dL — ABNORMAL HIGH (ref <200)
HDL: 49 mg/dL — ABNORMAL LOW (ref >=50)
LDL Cholesterol (Calc): 148 mg/dL — ABNORMAL HIGH
Non-HDL Cholesterol (Calc): 176 mg/dL — ABNORMAL HIGH (ref <130)
Total CHOL/HDL Ratio: 4.6 (calc) (ref <5.0)
Triglycerides: 151 mg/dL — ABNORMAL HIGH (ref <150)

## 2023-05-05 LAB — THYROID PANEL WITH TSH
Free Thyroxine Index: 1.9 (ref 1.4–3.8)
T3 Uptake: 29 % (ref 22–35)
T4, Total: 6.4 ug/dL (ref 5.1–11.9)
TSH: 1.51 m[IU]/L

## 2023-05-06 LAB — CYTOLOGY - PAP
Adequacy: ABSENT
Comment: NEGATIVE
Diagnosis: NEGATIVE
High risk HPV: NEGATIVE

## 2023-05-18 ENCOUNTER — Ambulatory Visit (INDEPENDENT_AMBULATORY_CARE_PROVIDER_SITE_OTHER): Payer: 59 | Admitting: Physician Assistant

## 2023-05-18 VITALS — BP 130/82 | HR 96 | Temp 97.4°F | Ht 67.0 in | Wt 340.2 lb

## 2023-05-18 DIAGNOSIS — R7989 Other specified abnormal findings of blood chemistry: Secondary | ICD-10-CM

## 2023-05-18 DIAGNOSIS — E785 Hyperlipidemia, unspecified: Secondary | ICD-10-CM | POA: Diagnosis not present

## 2023-05-18 DIAGNOSIS — D72829 Elevated white blood cell count, unspecified: Secondary | ICD-10-CM | POA: Diagnosis not present

## 2023-05-18 DIAGNOSIS — R5383 Other fatigue: Secondary | ICD-10-CM

## 2023-05-18 DIAGNOSIS — E88819 Insulin resistance, unspecified: Secondary | ICD-10-CM

## 2023-05-18 LAB — CBC WITH DIFFERENTIAL/PLATELET
Basophils Absolute: 0.1 10*3/uL (ref 0.0–0.1)
Basophils Relative: 0.6 % (ref 0.0–3.0)
Eosinophils Absolute: 0.6 10*3/uL (ref 0.0–0.7)
Eosinophils Relative: 5.1 % — ABNORMAL HIGH (ref 0.0–5.0)
HCT: 37.1 % (ref 36.0–46.0)
Hemoglobin: 12.1 g/dL (ref 12.0–15.0)
Lymphocytes Relative: 23.1 % (ref 12.0–46.0)
Lymphs Abs: 2.6 10*3/uL (ref 0.7–4.0)
MCHC: 32.6 g/dL (ref 30.0–36.0)
MCV: 87.7 fL (ref 78.0–100.0)
Monocytes Absolute: 0.7 10*3/uL (ref 0.1–1.0)
Monocytes Relative: 5.9 % (ref 3.0–12.0)
Neutro Abs: 7.3 10*3/uL (ref 1.4–7.7)
Neutrophils Relative %: 65.3 % (ref 43.0–77.0)
Platelets: 345 10*3/uL (ref 150.0–400.0)
RBC: 4.23 Mil/uL (ref 3.87–5.11)
RDW: 14.3 % (ref 11.5–15.5)
WBC: 11.2 10*3/uL — ABNORMAL HIGH (ref 4.0–10.5)

## 2023-05-18 LAB — URINALYSIS, ROUTINE W REFLEX MICROSCOPIC
Bilirubin Urine: NEGATIVE
Hgb urine dipstick: NEGATIVE
Ketones, ur: NEGATIVE
Leukocytes,Ua: NEGATIVE
Nitrite: NEGATIVE
Specific Gravity, Urine: 1.01 (ref 1.000–1.030)
Total Protein, Urine: NEGATIVE
Urine Glucose: NEGATIVE
Urobilinogen, UA: 0.2 (ref 0.0–1.0)
pH: 6.5 (ref 5.0–8.0)

## 2023-05-18 LAB — VITAMIN B12: Vitamin B-12: 495 pg/mL (ref 211–911)

## 2023-05-18 LAB — IBC + FERRITIN
Ferritin: 31.3 ng/mL (ref 10.0–291.0)
Iron: 39 ug/dL — ABNORMAL LOW (ref 42–145)
Saturation Ratios: 11.5 % — ABNORMAL LOW (ref 20.0–50.0)
TIBC: 340.2 ug/dL (ref 250.0–450.0)
Transferrin: 243 mg/dL (ref 212.0–360.0)

## 2023-05-18 LAB — VITAMIN D 25 HYDROXY (VIT D DEFICIENCY, FRACTURES): VITD: 14.02 ng/mL — ABNORMAL LOW (ref 30.00–100.00)

## 2023-05-18 MED ORDER — METFORMIN HCL ER 750 MG PO TB24: 750.0000 mg | ORAL_TABLET | Freq: Every day | ORAL | 1 refills | Status: DC

## 2023-05-18 NOTE — Patient Instructions (Signed)
It was great to see you!  Start metformin - take with food  Keep me posted on how you are doing with this  Let's follow-up in 3-6 months, sooner if you have concerns.  Take care,  [name] PA-C

## 2023-05-18 NOTE — Progress Notes (Signed)
Amanda Davis is a 37 y.o. female here for a follow-up of a pre-existing problem.  History of Present Illness:   Chief Complaint  Patient presents with   Fatigue   Labs     Labs recently drawn at GYN office was told to follow up with PCP about the elevated labs    HPI  Fatigue: Complains of constant excessive fatigue.  Reports that she did undergo a sleep study and has been using her CPAP machine, but is still experiencing excessive tiredness.   Elevated Cholesterol: Presents to discuss her elevated cholesterol, noted 11/25.  Denies fhx of cardiac issues.  Lab Results  Component Value Date   CHOL 225 (H) 05/04/2023   HDL 49 (L) 05/04/2023   LDLCALC 148 (H) 05/04/2023   TRIG 151 (H) 05/04/2023   CHOLHDL 4.6 05/04/2023   Pre-Diabetes: Presents to discuss her most recent A1c from 11/25.  Discussed preventative management of her insulin resistance - expressed interest in Metformin.  Denies hypoglycemic or hyperglycemic episodes or symptoms.  Lab Results  Component Value Date   HGBA1C 5.8 (H) 05/04/2023    Elevated CBC: Presents to discuss her elevated platelets and WBC.  Platelet levels at 436 - She endorses occasional heavy periods Reports she was never recommended to take iron and does not donate blood. Denies rectal bleeding  WBC of 14.1 - denies recent illness or steroid use.  Past Medical History:  Diagnosis Date   Abnormal Pap smear of cervix ~2005   colpo biopsy   Allergy    Latex allergy   Anxiety    Chronic intermittent hypoxia with obstructive sleep apnea 12/23/2022   Depression    Hemorrhage after delivery of fetus 2011   Hyperlipidemia 03/12/2021   SVD (spontaneous vaginal delivery)    x 2    Social History   Tobacco Use   Smoking status: Former    Current packs/day: 0.00    Average packs/day: 0.3 packs/day for 7.0 years (1.8 ttl pk-yrs)    Types: Cigarettes    Start date: 07/27/2007    Quit date: 07/26/2014    Years since quitting: 8.8    Smokeless tobacco: Never  Vaping Use   Vaping status: Never Used  Substance Use Topics   Alcohol use: Yes    Alcohol/week: 1.0 - 2.0 standard drink of alcohol    Types: 1 - 2 Glasses of wine per week   Drug use: No   Past Surgical History:  Procedure Laterality Date   APPENDECTOMY  2003   BREAST SURGERY Bilateral 11/14/2002   Breast Reduction    COLPOSCOPY  ~2005   Normal - per pt   KNEE SURGERY     left   LAPAROSCOPIC BILATERAL SALPINGECTOMY N/A 02/28/2016   Procedure: LAPAROSCOPIC BILATERAL SALPINGECTOMY;  Surgeon: Romualdo Bolk, MD;  Location: [facility];  Service: Gynecology;  Laterality: N/A;  Beth RNFA   TUBAL LIGATION     WISDOM TOOTH EXTRACTION     Family History  Adopted: Yes  Problem Relation Age of Onset   Stroke Maternal Grandmother 45   Heart disease Maternal Grandmother    Allergies  Allergen Reactions   Tape Hives   Gabapentin Other (See Comments)    Headaches   Latex Rash   Current Medications:   Current Outpatient Medications:    ALPRAZolam (XANAX) 0.25 MG tablet, Take 1 tablet (0.25 mg total) by mouth 2 (two) times daily as needed for anxiety., Disp: 30 tablet, Rfl: 1   furosemide (LASIX) 20  MG tablet, Take 1 tablet (20 mg total) by mouth daily as needed for fluid., Disp: 30 tablet, Rfl: 3   magnesium (MAGTAB) 84 MG ( ) TBCR SR tablet, Take 250 mg by mouth., Disp: , Rfl:    Multiple Vitamin (MULTIVITAMIN) tablet, Take 1 tablet by mouth at bedtime. , Disp: , Rfl:    pregabalin (LYRICA) 75 MG capsule, Take 75 mg by mouth as needed., Disp: , Rfl:   Review of Systems:   ROS See pertinent positives and negatives as per the HPI.  Vitals:   Vitals:   05/18/23 1257  BP: 130/82  Pulse: 96  Temp: (!) 97.4 F (36.3 C)  TempSrc: Temporal  SpO2: 99%  Weight: (!) 340 lb 3.2 oz (154.3 kg)  Height: 5\' 7"  (1.702 m)     Body mass index is 53.28 kg/m.  Physical Exam:   Physical Exam Constitutional:      Appearance: Normal appearance. She is  well-developed.  HENT:     Head: Normocephalic and atraumatic.  Eyes:     General: Lids are normal.     Extraocular Movements: Extraocular movements intact.     Conjunctiva/sclera: Conjunctivae normal.  Pulmonary:     Effort: Pulmonary effort is normal.  Musculoskeletal:        General: Normal range of motion.     Cervical back: Normal range of motion and neck supple.  Skin:    General: Skin is warm and dry.  Neurological:     Mental Status: She is alert and oriented to person, place, and time.  Psychiatric:        Attention and Perception: Attention and perception normal.        Mood and Affect: Mood normal.        Behavior: Behavior normal.        Thought Content: Thought content normal.        Judgment: Judgment normal.     Assessment and Plan:   Other fatigue UpToDate on all cancer screenings Will update blood work and provide recommendations accordingly  Hyperlipidemia, unspecified hyperlipidemia type Reviewed that she does not meet criteria for medication at this time Will check annually  Leukocytosis, unspecified type Unclear etiology Will recheck today and advise further Denies symptom(s) or red flags  Insulin resistance Reviewed Agreeable to metformin, insurance does not currently cover GLP-1s for weight loss or pre-diabetes Discussed risks/benefits/side effect(s) Follow-up in 3-6 month(s), sooner if concerns  Elevated platelet count Suspect related to iron deficiency anemia or recent elevated white blood cell(s) count Recheck today as well as iron panel Will advise further based on results  I,Emily Lagle,acting as a scribe for [employer], PA.,have documented all relevant documentation on the behalf of [name], PA,as directed by  [name], PA while in the presence of [name], [place].  I, [name], [place], have reviewed all documentation for this visit. The documentation on 05/18/23 for the exam, diagnosis, procedures, and orders  are all accurate and complete.  [name], PA-C

## 2023-05-19 ENCOUNTER — Other Ambulatory Visit: Payer: Self-pay | Admitting: Physician Assistant

## 2023-05-19 MED ORDER — VITAMIN D (ERGOCALCIFEROL) 1.25 MG (50000 UNIT) PO CAPS: 50000.0000 [IU] | ORAL_CAPSULE | ORAL | 0 refills | Status: DC

## 2023-07-20 ENCOUNTER — Telehealth: Payer: Self-pay | Admitting: *Deleted

## 2023-07-20 MED ORDER — OSELTAMIVIR PHOSPHATE 75 MG PO CAPS: 75.0000 mg | ORAL_CAPSULE | Freq: Every day | ORAL | 0 refills | Status: DC

## 2023-07-20 NOTE — Telephone Encounter (Signed)
 Copied from CRM [id number]. Topic: Clinical - Medication Question >> Jul 20, 2023  8:34 AM [place] E wrote: Reason for CRM: Patient called in stating that her daughter got diagnosed with the flu over the weekend and patient is wondering if Dr. Roark Chick can prescribe her a preventative dose of Tamiful so that she does not contract the flu.

## 2023-07-20 NOTE — Telephone Encounter (Signed)
 Spoke to pt told her Rx for Tamiflu  75 mg daily x 10 days sent to pharmacy. Pt verbalized understanding.

## 2023-07-20 NOTE — Telephone Encounter (Signed)
 Please see message and advise

## 2023-08-04 DIAGNOSIS — K760 Fatty (change of) liver, not elsewhere classified: Secondary | ICD-10-CM | POA: Insufficient documentation

## 2023-09-08 NOTE — Progress Notes (Unsigned)
 [facility] [address] [place]. [place] [address] [phone]       OFFICE FOLLOW UP NOTE  [name] Date of Birth:  01-26-1986 Medical Record Number:  [id number]    Primary neurologist: Dr. Vickey Huger Reason for visit: CPAP follow-up  Virtual Visit via Video Note  I connected with Amanda Davis on 09/09/23 at  8:00 AM EDT by a video enabled telemedicine application and verified that I am speaking with the correct person using two identifiers.  Location: Patient: at home Provider: in office, [facility]   I discussed the limitations of evaluation and management by telemedicine and the availability of in person appointments. The patient expressed understanding and agreed to proceed.   SUBJECTIVE:   Follow-up visit:  Prior visit: 03/30/2023  Brief HPI:   Amanda Davis is a 38 y.o. female who was initially evaluated by Dr. Vickey Huger on 11/26/2022 due to concern of underlying sleep apnea with reporting low oxygen saturations, snoring and apnea during hospitalization in 08/2022 s/p lumbar fusion.  ESS 7/24.  HST 12/10/2022 showed mild sleep apnea with total AHI 13.9/h with very strong REM sleep dependency with REM AHI 33.9/h.  AutoPap initiated 01/09/2023. DME [employer] [facility].     Interval history:  Returns for 56-month CPAP follow-up visit.  Reports difficulty using over the past several months due to allergies with increased congestion and nasal inflammation.  Has been using Xyzal and use of Mucinex nasal spray as needed.  She also underwent cholecystectomy in 07/2023.  Continues to use FFM, has been tolerating better and no longer feeling claustrophobic.  Reports continued benefit with CPAP use including sleep quality and daytime energy levels.  DME [facility], up to date on supplies.  No questions or concerns today.  ESS 2/24 (prior to CPAP 7/24)          ROS:   14 system review of systems performed and negative with exception of  those listed in HPI  PMH:  Past Medical History:  Diagnosis Date   Abnormal Pap smear of cervix ~2005   colpo biopsy   Allergy    Latex allergy   Anxiety    Chronic intermittent hypoxia with obstructive sleep apnea 12/23/2022   Depression    Hemorrhage after delivery of fetus 2011   Hyperlipidemia 03/12/2021   SVD (spontaneous vaginal delivery)    x 2    PSH:  Past Surgical History:  Procedure Laterality Date   APPENDECTOMY  2003   BREAST SURGERY Bilateral 11/14/2002   Breast Reduction    COLPOSCOPY  ~2005   Normal - per pt   KNEE SURGERY     left   LAPAROSCOPIC BILATERAL SALPINGECTOMY N/A 02/28/2016   Procedure: LAPAROSCOPIC BILATERAL SALPINGECTOMY;  Surgeon: Romualdo Bolk, MD;  Location: [facility];  Service: Gynecology;  Laterality: N/A;  Beth RNFA   TUBAL LIGATION     WISDOM TOOTH EXTRACTION      Social History:  Social History   Socioeconomic History   Marital status: Divorced    Spouse name: Not on file   Number of children: Not on file   Years of education: Not on file   Highest education level: Master's degree (e.g., MA, MS, MEng, MEd, MSW, MBA)  Occupational History   Not on file  Tobacco Use   Smoking status: Former    Current packs/day: 0.00    Average packs/day: 0.3 packs/day for 7.0 years (1.8 ttl pk-yrs)    Types: Cigarettes    Start  date: 07/27/2007    Quit date: 07/26/2014    Years since quitting: 9.1   Smokeless tobacco: Never  Vaping Use   Vaping status: Never Used  Substance and Sexual Activity   Alcohol use: Yes    Alcohol/week: 1.0 - 2.0 standard drink of alcohol    Types: 1 - 2 Glasses of wine per week   Drug use: No   Sexual activity: Yes    Partners: Male    Birth control/protection: Surgical    Comment: tubal ligation  Other Topics Concern   Not on file  Social History Narrative   Single mom   8 and 31 yo children   Work for [employer]    Landscape architect in [employer]      Social Drivers of Health   Financial  Resource Strain: Low Risk  (05/18/2023)   Overall Financial Resource Strain (CARDIA)    Difficulty of Paying Living Expenses: Not hard at all  Food Insecurity: No Food Insecurity (05/18/2023)   Hunger Vital Sign    Worried About Running Out of Food in the Last Year: Never true    Ran Out of Food in the Last Year: Never true  Transportation Needs: No Transportation Needs (05/18/2023)   PRAPARE - Administrator, Civil Service (Medical): No    Lack of Transportation (Non-Medical): No  Physical Activity: Unknown (05/18/2023)   Exercise Vital Sign    Days of Exercise per Week: 0 days    Minutes of Exercise per Session: Not on file  Stress: No Stress Concern Present (05/18/2023)   [employer] of Occupational Health - Occupational Stress Questionnaire    Feeling of Stress : Only a little  Social Connections: Moderately Integrated (05/18/2023)   Social Connection and Isolation Panel [NHANES]    Frequency of Communication with Friends and Family: More than three times a week    Frequency of Social Gatherings with Friends and Family: Once a week    Attends Religious Services: More than 4 times per year    Active Member of [employer] or Organizations: No    Attends Engineer, structural: Not on file    Marital Status: Living with partner  Intimate Partner Violence: Not on file    Family History:  Family History  Adopted: Yes  Problem Relation Age of Onset   Stroke Maternal Grandmother 45   Heart disease Maternal Grandmother     Medications:   Current Outpatient Medications on File Prior to Visit  Medication Sig Dispense Refill   ALPRAZolam (XANAX) 0.25 MG tablet Take 1 tablet (0.25 mg total) by mouth 2 (two) times daily as needed for anxiety. 30 tablet 1   furosemide (LASIX) 20 MG tablet Take 1 tablet (20 mg total) by mouth daily as needed for fluid. 30 tablet 3   magnesium (MAGTAB) 84 MG ( ) TBCR SR tablet Take 250 mg by mouth.     metFORMIN (GLUCOPHAGE-XR) 750 MG 24  hr tablet Take 1 tablet (750 mg total) by mouth daily with breakfast. 90 tablet 1   Multiple Vitamin (MULTIVITAMIN) tablet Take 1 tablet by mouth at bedtime.      oseltamivir (TAMIFLU) 75 MG capsule Take 1 capsule (75 mg total) by mouth daily. 10 capsule 0   pregabalin (LYRICA) 75 MG capsule Take 75 mg by mouth as needed.     Vitamin D, Ergocalciferol, (DRISDOL) 1.25 MG (50000 UNIT) CAPS capsule Take 1 capsule (50,000 Units total) by mouth every 7 (seven) days. 12 capsule 0  No current facility-administered medications on file prior to visit.    Allergies:   Allergies  Allergen Reactions   Tape Hives   Gabapentin Other (See Comments)    Headaches   Latex Rash      OBJECTIVE:  Physical Exam General: well developed, well nourished, very pleasant middle-age Caucasian female, seated, in no evident distress  Neurologic Exam Mental Status: Awake and fully alert. Oriented to place and time. Recent and remote memory intact. Attention span, concentration and fund of knowledge appropriate. Mood and affect appropriate.          ASSESSMENT/PLAN: Amanda Davis is a 38 y.o. year old female    OSA on CPAP : Compliance report shows suboptimal usage over the past several months due to increased issues with allergies with nasal congestion and inflammation.  Recommend continued use of Xyzal and can try nasal spray such as fluticasone and routine use of Nettie Potts.  Continue current pressure settings of 5-16 with EPR 3 as apnea well-controlled when using.  Prior difficulty tolerating FFM d/t claustrophobia but this has since improved.  Discussed importance of nightly CPAP usage with ensuring greater than 4 hours per night for optimal benefit. Continue to follow with DME company for any needed supplies or CPAP related concerns     Follow up in 1 year via MyChart VV or call earlier if needed   CC:  PCP: [name], PA     Ihor Austin, AGNP-BC  [facility] [address] [place], [place] [address]  Phone [phone] Fax [phone] Note: This document was prepared with digital dictation and possible smart phrase technology. Any transcriptional errors that result from this process are unintentional.

## 2023-09-09 ENCOUNTER — Telehealth (INDEPENDENT_AMBULATORY_CARE_PROVIDER_SITE_OTHER): Admitting: Adult Health

## 2023-09-09 ENCOUNTER — Telehealth: Payer: Self-pay

## 2023-09-09 ENCOUNTER — Encounter: Payer: Self-pay | Admitting: Adult Health

## 2023-09-09 DIAGNOSIS — G4733 Obstructive sleep apnea (adult) (pediatric): Secondary | ICD-10-CM | POA: Diagnosis not present

## 2023-10-01 ENCOUNTER — Telehealth: Payer: 59 | Admitting: Adult Health

## 2023-10-19 ENCOUNTER — Ambulatory Visit (INDEPENDENT_AMBULATORY_CARE_PROVIDER_SITE_OTHER): Payer: 59 | Admitting: Physician Assistant

## 2023-10-19 VITALS — BP 119/81 | HR 76 | Temp 97.3°F | Ht 67.75 in | Wt 344.0 lb

## 2023-10-19 DIAGNOSIS — F32A Depression, unspecified: Secondary | ICD-10-CM

## 2023-10-19 DIAGNOSIS — F419 Anxiety disorder, unspecified: Secondary | ICD-10-CM

## 2023-10-19 DIAGNOSIS — G4733 Obstructive sleep apnea (adult) (pediatric): Secondary | ICD-10-CM | POA: Diagnosis not present

## 2023-10-19 DIAGNOSIS — E559 Vitamin D deficiency, unspecified: Secondary | ICD-10-CM | POA: Diagnosis not present

## 2023-10-19 DIAGNOSIS — D509 Iron deficiency anemia, unspecified: Secondary | ICD-10-CM | POA: Diagnosis not present

## 2023-10-19 DIAGNOSIS — E88819 Insulin resistance, unspecified: Secondary | ICD-10-CM

## 2023-10-19 DIAGNOSIS — E785 Hyperlipidemia, unspecified: Secondary | ICD-10-CM | POA: Diagnosis not present

## 2023-10-19 DIAGNOSIS — Z0000 Encounter for general adult medical examination without abnormal findings: Secondary | ICD-10-CM

## 2023-10-19 MED ORDER — ALPRAZOLAM 0.25 MG PO TABS: 0.2500 mg | ORAL_TABLET | Freq: Two times a day (BID) | ORAL | 1 refills | Status: AC | PRN

## 2023-10-19 NOTE — Progress Notes (Signed)
 Subjective:    Amanda Davis is a 38 y.o. female and is here for a comprehensive physical exam.  HPI  Health Maintenance Due  Topic Date Due   COVID-19 Vaccine (4 - 2024-25 season) 02/08/2023    Acute Concerns: None  Chronic Issues: Anxiety Pt is on Xanax  0.25 mg as needed. Pt reports recent life stressor with family. She states DSS was called on her recently by a family member in regard to her daughter. She also states the case has since been cleared, but feels like she is "walking on eggshells" at home. Pt would like a refill on Xanax .  Obesity/OSA Pt is not taking Metformin  due to intolerance. She is agreeable to starting Zepbound  and would like blood work done today. She is tolerating CPAP Her AHI was 25.2/h  Bilateral feet swelling Pt is on Lasix  20 mg once daily as needed. She states she has not needed it recently. She reports the last time her feet swelled, it self-resolved.  Health Maintenance: Immunizations -- See above. Colonoscopy -- N/a Mammogram -- Last done 08/18/2019. Results show no mammographic or sonographic evidence of malignancy. 5 year repeat recommended, next due at age 52 in Nov 2025 PAP -- Last done 05/04/2023, results were NILM. 3 year repeat recommended, next due 04/2026. Bone Density -- N/a Diet -- Adhering to a high protein diet with son. Exercise -- Exercises with son 2-3 times a week; walks for 45 mins on treadmill.  Sleep habits -- Pt reports difficulties using CPAP. Mood -- Overall stable, but notes recent anxiety.  UTD with dentist? - No. UTD with eye doctor? - Yes.  Weight history: Wt Readings from Last 10 Encounters:  10/19/23 (!) 344 lb (156 kg)  05/18/23 (!) 340 lb 3.2 oz (154.3 kg)  05/04/23 (!) 334 lb (151.5 kg)  03/30/23 (!) 333 lb (151 kg)  11/26/22 (!) 318 lb 3.2 oz (144.3 kg)  10/15/22 (!) 319 lb 8 oz (144.9 kg)  08/26/22 (!) 307 lb (139.3 kg)  05/26/22 (!) 309 lb 8 oz (140.4 kg)  04/01/22 298 lb (135.2 kg)   02/03/22 295 lb 3.2 oz (133.9 kg)   Body mass index is 52.69 kg/m. Patient's last menstrual period was 09/21/2023 (approximate).  Alcohol use:  reports current alcohol use of about 1.0 - 2.0 standard drink of alcohol per week.  Tobacco use:  Tobacco Use: Medium Risk (09/09/2023)   Received from [facility]   Patient History    Smoking Tobacco Use: Former    Smokeless Tobacco Use: Never    Passive Exposure: Not on file   Eligible for lung cancer screening? no     10/19/2023    2:59 PM  Depression screen PHQ 2/9  Decreased Interest 0  Down, Depressed, Hopeless 0  PHQ - 2 Score 0     Other providers/specialists: Patient Care Team: Alexander Iba, [place] as PCP - General (Physician Assistant) Laine Piggs, NP as Nurse Practitioner (Obstetrics and Gynecology)    PMHx, SurgHx, SocialHx, Medications, and Allergies were reviewed in the Visit Navigator and updated as appropriate.   Past Medical History:  Diagnosis Date   Abnormal Pap smear of cervix ~2005   colpo biopsy   Allergy    Latex allergy   Anxiety    Chronic intermittent hypoxia with obstructive sleep apnea 12/23/2022   Depression    Hemorrhage after delivery of fetus 2011   Hyperlipidemia 03/12/2021   SVD (spontaneous vaginal delivery)    x 2  Past Surgical History:  Procedure Laterality Date   APPENDECTOMY  2003   BREAST SURGERY Bilateral 11/14/2002   Breast Reduction    COLPOSCOPY  ~2005   Normal - per pt   KNEE SURGERY     left   LAPAROSCOPIC BILATERAL SALPINGECTOMY N/A 02/28/2016   Procedure: LAPAROSCOPIC BILATERAL SALPINGECTOMY;  Surgeon: Wanita Gutta, MD;  Location: [facility];  Service: Gynecology;  Laterality: N/A;  Beth RNFA   TUBAL LIGATION     WISDOM TOOTH EXTRACTION       Family History  Adopted: Yes  Problem Relation Age of Onset   Stroke Maternal Grandmother 45   Heart disease Maternal Grandmother     Social History   Tobacco Use   Smoking status: Former     Current packs/day: 0.00    Average packs/day: 0.3 packs/day for 7.0 years (1.8 ttl pk-yrs)    Types: Cigarettes    Start date: 07/27/2007    Quit date: 07/26/2014    Years since quitting: 9.2   Smokeless tobacco: Never  Vaping Use   Vaping status: Never Used  Substance Use Topics   Alcohol use: Yes    Alcohol/week: 1.0 - 2.0 standard drink of alcohol    Types: 1 - 2 Glasses of wine per week   Drug use: No    Review of Systems:   Review of Systems  Constitutional:  Negative for chills, fever, malaise/fatigue and weight loss.  HENT:  Negative for hearing loss, sinus pain and sore throat.   Respiratory:  Negative for cough and hemoptysis.   Cardiovascular:  Negative for chest pain, palpitations, leg swelling and PND.  Gastrointestinal:  Negative for abdominal pain, constipation, diarrhea, heartburn, nausea and vomiting.  Genitourinary:  Negative for dysuria, frequency and urgency.  Musculoskeletal:  Negative for back pain, myalgias and neck pain.  Skin:  Negative for itching and rash.  Neurological:  Negative for dizziness, tingling, seizures and headaches.  Endo/Heme/Allergies:  Negative for polydipsia.  Psychiatric/Behavioral:  Negative for depression. The patient is not nervous/anxious.      Objective:   BP 119/81   Pulse 76   Temp (!) 97.3 F (36.3 C) (Temporal)   Ht 5' 7.75" (1.721 m)   Wt (!) 344 lb (156 kg)   LMP 09/21/2023 (Approximate)   SpO2 97%   BMI 52.69 kg/m  Body mass index is 52.69 kg/m.   General Appearance:    Alert, cooperative, no distress, appears stated age  Head:    Normocephalic, without obvious abnormality, atraumatic  Eyes:    PERRL, conjunctiva/corneas clear, EOM's intact, fundi    benign, both eyes  Ears:    Normal TM's and external ear canals, both ears  Nose:   Nares normal, septum midline, mucosa normal, no drainage    or sinus tenderness  Throat:   Lips, mucosa, and tongue normal; teeth and gums normal  Neck:   Supple, symmetrical,  trachea midline, no adenopathy;    thyroid :  no enlargement/tenderness/nodules; no carotid   bruit or JVD  Back:     Symmetric, no curvature, ROM normal, no CVA tenderness  Lungs:     Clear to auscultation bilaterally, respirations unlabored  Chest Wall:    No tenderness or deformity   Heart:    Regular rate and rhythm, S1 and S2 normal, no murmur, rub or gallop  Breast Exam:    Deferred  Abdomen:     Soft, non-tender, bowel sounds active all four quadrants,    no masses, no  organomegaly  Genitalia:    Deferred  Extremities:   Extremities normal, atraumatic, no cyanosis or edema  Pulses:   2+ and symmetric all extremities  Skin:   Skin color, texture, turgor normal, no rashes or lesions  Lymph nodes:   Cervical, supraclavicular, and axillary nodes normal  Neurologic:   CNII-XII intact, normal strength, sensation and reflexes    throughout    Assessment/Plan:   Routine physical examination Today patient counseled on age appropriate routine health concerns for screening and prevention, each reviewed and up to date or declined. Immunizations reviewed and up to date or declined. Labs ordered and reviewed. Risk factors for depression reviewed and negative. Hearing function and visual acuity are intact. ADLs screened and addressed as needed. Functional ability and level of safety reviewed and appropriate. Education, counseling and referrals performed based on assessed risks today. Patient provided with a copy of personalized plan for preventive services.  Hyperlipidemia, unspecified hyperlipidemia type Update lipid panel and provide recommendations   Insulin resistance Update Hemoglobin A1c and provide recommendations   OSA (obstructive sleep apnea) Compliant with CPAP Will trial to order zepbound   Vitamin D  deficiency Update vitamin D    Iron deficiency anemia, unspecified iron deficiency anemia type Update blood work and provide recommendations   Anxiety and depression Overall  stable Continue xanax  0.25 mg twice daily as needed Denies suicidal ideation/hi   I, Timoteo Force, acting as a Neurosurgeon for [employer], [place]., have documented all relevant documentation on the behalf of Alexander Iba, [place], as directed by  Alexander Iba, PA while in the presence of Alexander Iba, [place].  I, Alexander Iba, [place], have reviewed all documentation for this visit. The documentation on 10/19/23 for the exam, diagnosis, procedures, and orders are all accurate and complete.  Alexander Iba, PA-C [facility]

## 2023-10-19 NOTE — Patient Instructions (Signed)
 It was great to see you!  Xanax  refilled  I will re-send Zepbound  under your OSA diagnosis -- fingers crossed we can get this done  Mammogram due at age 38!  Let's follow-up in 1 year -- sooner if we are able to get Zepbound  started (3 month(s) follow up), sooner if you have concerns.  Take care,  Alexander Iba PA-C

## 2023-10-20 LAB — IBC + FERRITIN
Ferritin: 32.8 ng/mL (ref 10.0–291.0)
Iron: 47 ug/dL (ref 42–145)
Saturation Ratios: 13 % — ABNORMAL LOW (ref 20.0–50.0)
TIBC: 362.6 ug/dL (ref 250.0–450.0)
Transferrin: 259 mg/dL (ref 212.0–360.0)

## 2023-10-20 LAB — CBC WITH DIFFERENTIAL/PLATELET
Basophils Absolute: 0.1 10*3/uL (ref 0.0–0.1)
Basophils Relative: 1.3 % (ref 0.0–3.0)
Eosinophils Absolute: 0.5 10*3/uL (ref 0.0–0.7)
Eosinophils Relative: 4.7 % (ref 0.0–5.0)
HCT: 38.3 % (ref 36.0–46.0)
Hemoglobin: 12.4 g/dL (ref 12.0–15.0)
Lymphocytes Relative: 29.5 % (ref 12.0–46.0)
Lymphs Abs: 2.9 10*3/uL (ref 0.7–4.0)
MCHC: 32.5 g/dL (ref 30.0–36.0)
MCV: 86.5 fl (ref 78.0–100.0)
Monocytes Absolute: 0.6 10*3/uL (ref 0.1–1.0)
Monocytes Relative: 5.6 % (ref 3.0–12.0)
Neutro Abs: 5.8 10*3/uL (ref 1.4–7.7)
Neutrophils Relative %: 58.9 % (ref 43.0–77.0)
Platelets: 366 10*3/uL (ref 150.0–400.0)
RBC: 4.43 Mil/uL (ref 3.87–5.11)
RDW: 14.2 % (ref 11.5–15.5)
WBC: 9.8 10*3/uL (ref 4.0–10.5)

## 2023-10-20 LAB — COMPREHENSIVE METABOLIC PANEL WITH GFR
ALT: 47 U/L — ABNORMAL HIGH (ref 0–35)
AST: 34 U/L (ref 0–37)
Albumin: 4.2 g/dL (ref 3.5–5.2)
Alkaline Phosphatase: 75 U/L (ref 39–117)
BUN: 6 mg/dL (ref 6–23)
CO2: 26 meq/L (ref 19–32)
Calcium: 9.1 mg/dL (ref 8.4–10.5)
Chloride: 103 meq/L (ref 96–112)
Creatinine, Ser: 0.65 mg/dL (ref 0.40–1.20)
GFR: 110.83 mL/min (ref >60.00)
Glucose, Bld: 83 mg/dL (ref 70–99)
Potassium: 4 meq/L (ref 3.5–5.1)
Sodium: 138 meq/L (ref 135–145)
Total Bilirubin: 0.6 mg/dL (ref 0.2–1.2)
Total Protein: 7.5 g/dL (ref 6.0–8.3)

## 2023-10-20 LAB — LIPID PANEL
Cholesterol: 194 mg/dL (ref 0–200)
HDL: 46.4 mg/dL (ref >39.00)
LDL Cholesterol: 129 mg/dL — ABNORMAL HIGH (ref 0–99)
NonHDL: 147.38
Total CHOL/HDL Ratio: 4
Triglycerides: 94 mg/dL (ref 0.0–149.0)
VLDL: 18.8 mg/dL (ref 0.0–40.0)

## 2023-10-20 LAB — VITAMIN B12: Vitamin B-12: 558 pg/mL (ref 211–911)

## 2023-10-20 LAB — HEMOGLOBIN A1C: Hgb A1c MFr Bld: 6 % (ref 4.6–6.5)

## 2023-10-20 LAB — VITAMIN D 25 HYDROXY (VIT D DEFICIENCY, FRACTURES): VITD: 22.41 ng/mL — ABNORMAL LOW (ref 30.00–100.00)

## 2023-10-21 ENCOUNTER — Ambulatory Visit: Payer: Self-pay | Admitting: Physician Assistant

## 2023-10-21 DIAGNOSIS — R748 Abnormal levels of other serum enzymes: Secondary | ICD-10-CM

## 2023-10-21 MED ORDER — VITAMIN D (ERGOCALCIFEROL) 1.25 MG (50000 UNIT) PO CAPS: 50000.0000 [IU] | ORAL_CAPSULE | ORAL | 0 refills | Status: DC

## 2023-12-18 ENCOUNTER — Encounter: Payer: Self-pay | Admitting: Physician Assistant

## 2023-12-21 ENCOUNTER — Other Ambulatory Visit: Payer: Self-pay | Admitting: Physician Assistant

## 2023-12-21 ENCOUNTER — Telehealth: Payer: Self-pay

## 2023-12-21 ENCOUNTER — Other Ambulatory Visit (HOSPITAL_COMMUNITY): Payer: Self-pay

## 2023-12-21 MED ORDER — ZEPBOUND 2.5 MG/0.5ML ~~LOC~~ SOAJ: 2.5000 mg | SUBCUTANEOUS | 0 refills | Status: DC

## 2023-12-21 NOTE — Telephone Encounter (Signed)
 Pharmacy Patient Advocate Encounter   Received notification from Onbase that prior authorization for Zepbound  2.5MG /0.5ML pen-injectors is required/requested.   Insurance verification completed.   The patient is insured through [facility] .   Per test claim: PA required; PA submitted to above mentioned insurance via CoverMyMeds Key/confirmation #/EOC [facility] Status is pending

## 2023-12-23 NOTE — Telephone Encounter (Signed)
 Pharmacy Patient Advocate Encounter  Received notification from [employer] that Prior Authorization for Zepbound  2.5MG /0.5ML pen-injectors  has been DENIED.  Full denial letter will be uploaded to the media tab. See denial reason below.   PA #/Case ID/Reference #: [id number]

## 2023-12-23 NOTE — Telephone Encounter (Signed)
 [name], PA for Zepbound  has been denied for OSA.

## 2024-02-26 ENCOUNTER — Telehealth (INDEPENDENT_AMBULATORY_CARE_PROVIDER_SITE_OTHER): Admitting: Physician Assistant

## 2024-02-26 VITALS — Ht 67.75 in | Wt 341.0 lb

## 2024-02-26 DIAGNOSIS — K760 Fatty (change of) liver, not elsewhere classified: Secondary | ICD-10-CM | POA: Diagnosis not present

## 2024-02-26 DIAGNOSIS — I517 Cardiomegaly: Secondary | ICD-10-CM | POA: Diagnosis not present

## 2024-02-26 DIAGNOSIS — R051 Acute cough: Secondary | ICD-10-CM | POA: Diagnosis not present

## 2024-02-26 NOTE — Progress Notes (Addendum)
 .   Virtual Visit via Video Note   I, Lucie Buttner, connected with  Sherlynn E Tullis  ([id number], 1986/02/01) on 03/07/24 at 10:40 AM EDT by a video-enabled telemedicine application and verified that I am speaking with the correct person using two identifiers.  Location: Patient: Home Provider: [facility] [place] office   I discussed the limitations of evaluation and management by telemedicine and the availability of in person appointments. The patient expressed understanding and agreed to proceed.    History of Present Illness: Amanda Davis is a 38 year old female who presents with concerns about her heart health and sleep apnea.  She went to the urgent care for recent upper respiratory infection (URI) with [facility] and had a CXR done.  She is concerned about her heart health following a recent imaging study, despite no prior heart disease and normal past imaging, including calcium scoring tests. She has a family history of stroke on her maternal grandmother's side but is unaware of other family heart disease. She acknowledges obesity as a potential contributor to heart issues. Her blood pressure is elevated only when in pain.  She experiences a persistent cough following an upper respiratory infection. She started treatment two days ago and uses an inhaler primarily at night and during her workday when talking extensively. She anticipates improvement over the weekend with reduced talking. She is on azithromycin and dexamethasone .  She does have history of hepatic steatosis on her calcium score.  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   Obesity 03/12/2021   Hyperlipidemia 03/12/2021   Anxiety and  depression 03/12/2021   Hx gestational diabetes 03/29/2012   Vitamin D  deficiency 03/29/2012    Allergies:  Allergies  Allergen Reactions   Tape Hives   Gabapentin Other (See Comments)    Headaches   Latex Rash   Medications:  Current Outpatient Medications:    albuterol (VENTOLIN HFA) 108 (90 Base) MCG/ACT inhaler, Inhale 2 puffs into the lungs every 4 (four) hours as needed., Disp: , Rfl:    ALPRAZolam  (XANAX ) 0.25 MG tablet, Take 1 tablet (0.25 mg total) by mouth 2 (two) times daily as needed for anxiety., Disp: 30 tablet, Rfl: 1   furosemide  (LASIX ) 20 MG tablet, Take 1 tablet (20 mg total) by mouth daily as needed for fluid., Disp: 30 tablet, Rfl: 3   Multiple Vitamin (MULTIVITAMIN) tablet, Take 1 tablet by mouth at bedtime. , Disp: , Rfl:   Observations/Objective: Patient is well-developed, well-nourished in no acute distress.  Resting comfortably  at home.  Head is normocephalic, atraumatic.  No labored breathing.  Speech is clear and coherent with logical content.  Patient is alert and oriented at baseline.     Assessment & Plan Cardiomegaly Abnormal finding on xray, I do not have images to review Discussed obtaining echocardiogram vs cardiology referral vs repeat xray She would like to repeat xray Reports there is no chest pain, shortness of breath, leg swelling  Cough, acute Persistent cough with upper respiratory infection. Inhaler used primarily at night and during work. Cough expected to improve with time and medication. - Use inhaler as needed, especially at night and during work. - Repeat chest x-ray in four weeks or when cough resolves.  Hepatic steatosis  Possible hepatic steatosis on imaging. Wegovy  indicated but not commonly covered by insurance. Fib-4 calculation may support Wegovy  coverage. - Order fib-4 calculation lab test. - Consider Wegovy  if fib-4 indicates moderate to severe steatosis.  Follow Up Instructions: I discussed the assessment and  treatment plan with the patient. The patient was provided an opportunity to ask questions and all were answered. The patient agreed with the plan and demonstrated an understanding of the instructions.  A copy of instructions were sent to the patient via MyChart unless otherwise noted below.   The patient was advised to call back or seek an in-person evaluation if the symptoms worsen or if the condition fails to improve as anticipated.  Lucie Buttner, PA History of Present Illness      Assessment and Plan:   Assessment and Plan Assessment & Plan Cardiomegaly Abnormal finding on xray, I do not have images to review Discussed obtaining echocardiogram vs cardiology referral vs repeat xray She would like to repeat xray Reports there is no chest pain, shortness of breath, leg swelling  Cough, acute Persistent cough with upper respiratory infection. Inhaler used primarily at night and during work. Cough expected to improve with time and medication. - Use inhaler as needed, especially at night and during work. - Repeat chest x-ray in four weeks or when cough resolves.  Hepatic steatosis Possible hepatic steatosis on imaging. Wegovy  indicated but not commonly covered by insurance. Fib-4 calculation may support Wegovy  coverage. - Order fib-4 calculation lab test. - Consider Wegovy  if fib-4 indicates moderate to severe steatosis.     Lucie Buttner, PA-C

## 2024-03-09 ENCOUNTER — Encounter: Payer: Self-pay | Admitting: Physician Assistant

## 2024-03-11 ENCOUNTER — Other Ambulatory Visit (HOSPITAL_COMMUNITY): Payer: Self-pay

## 2024-03-11 ENCOUNTER — Encounter: Payer: Self-pay | Admitting: Physician Assistant

## 2024-03-11 ENCOUNTER — Telehealth: Payer: Self-pay

## 2024-03-11 ENCOUNTER — Telehealth: Payer: Self-pay | Admitting: *Deleted

## 2024-03-11 ENCOUNTER — Ambulatory Visit (INDEPENDENT_AMBULATORY_CARE_PROVIDER_SITE_OTHER): Admitting: Physician Assistant

## 2024-03-11 VITALS — BP 120/84 | HR 82 | Temp 98.1°F | Ht 67.75 in | Wt 351.4 lb

## 2024-03-11 DIAGNOSIS — R229 Localized swelling, mass and lump, unspecified: Secondary | ICD-10-CM | POA: Diagnosis not present

## 2024-03-11 DIAGNOSIS — R052 Subacute cough: Secondary | ICD-10-CM

## 2024-03-11 DIAGNOSIS — G4733 Obstructive sleep apnea (adult) (pediatric): Secondary | ICD-10-CM

## 2024-03-11 MED ORDER — AMOXICILLIN-POT CLAVULANATE 875-125 MG PO TABS
1.0000 | ORAL_TABLET | Freq: Two times a day (BID) | ORAL | 0 refills | Status: AC
Start: 2024-03-11 — End: ?

## 2024-03-11 MED ORDER — PREDNISONE 20 MG PO TABS: 20.0000 mg | ORAL_TABLET | Freq: Two times a day (BID) | ORAL | 0 refills | Status: AC

## 2024-03-11 MED ORDER — QVAR REDIHALER 40 MCG/ACT IN AERB: 1.0000 | INHALATION_SPRAY | Freq: Two times a day (BID) | RESPIRATORY_TRACT | 0 refills | Status: AC

## 2024-03-11 MED ORDER — DOXYCYCLINE HYCLATE 100 MG PO TABS
100.0000 mg | ORAL_TABLET | Freq: Two times a day (BID) | ORAL | 0 refills | Status: DC
Start: 2024-03-11 — End: 2024-03-11

## 2024-03-11 MED ORDER — ZEPBOUND 2.5 MG/0.5ML ~~LOC~~ SOAJ: 2.5000 mg | SUBCUTANEOUS | 0 refills | Status: DC

## 2024-03-11 NOTE — Progress Notes (Addendum)
 Amanda Davis is a 38 y.o. female here for a follow up of a pre-existing problem.  History of Present Illness:   Chief Complaint  Patient presents with   Cough    Pt c/o continuous cough, was seen at [facility] on 9/18 was treated with antibiotics and steroids. Pt coughing and expectorating yellow sputum at times.   Discussed the use of AI scribe software for clinical note transcription with the patient, who gave verbal consent to proceed.  History of Present Illness   Amanda Davis is a 38 year old female who presents with cough.  Intermittent chest pain is primarily associated with coughing since last night, worsening with increased coughing. Previously diagnosed with bronchitis and prescribed a steroid. Albuterol use yesterday and twice the day before provided no relief. No over-the-counter medications have been taken.  No fever, shortness of breath, heartburn, hemoptysis, ear pain, nasal congestion, runny nose, or post-nasal drip. Occasional yellowish sputum and a wheezy cough are present. No sinus pressure, but a headache occurred this morning.  Recurrent coughs occur annually, often requiring two rounds of antibiotics and steroids for resolution. Never on a daily inhaler and no history of asthma.      She has been intentionally more consistent with her CPAP use and has 70% compliance -- she would like her Zepbound  prescription resubmitted under her insurance.  Right lower leg with swelling to area under knee. No pain or tenderness. No known trauma.    Past Medical History:  Diagnosis Date   Abnormal Pap smear of cervix ~2005   colpo biopsy   Allergy    Latex allergy   Anxiety    Chronic intermittent hypoxia with obstructive sleep apnea 12/23/2022   Depression    Hemorrhage after delivery of fetus 2011   Hyperlipidemia 03/12/2021   SVD (spontaneous vaginal delivery)    x 2     Social History   Tobacco Use   Smoking status: Former    Current packs/day: 0.00     Average packs/day: 0.3 packs/day for 7.0 years (1.8 ttl pk-yrs)    Types: Cigarettes    Start date: 07/27/2007    Quit date: 07/26/2014    Years since quitting: 9.6   Smokeless tobacco: Never  Vaping Use   Vaping status: Never Used  Substance Use Topics   Alcohol use: Yes    Alcohol/week: 1.0 - 2.0 standard drink of alcohol    Types: 1 - 2 Glasses of wine per week   Drug use: No    Past Surgical History:  Procedure Laterality Date   APPENDECTOMY  2003   BREAST SURGERY Bilateral 11/14/2002   Breast Reduction    COLPOSCOPY  ~2005   Normal - per pt   KNEE SURGERY     left   LAPAROSCOPIC BILATERAL SALPINGECTOMY N/A 02/28/2016   Procedure: LAPAROSCOPIC BILATERAL SALPINGECTOMY;  Surgeon: Kate Hargis Nearing, MD;  Location: [facility];  Service: Gynecology;  Laterality: N/A;  Beth RNFA   TUBAL LIGATION     WISDOM TOOTH EXTRACTION      Family History  Adopted: Yes  Problem Relation Age of Onset   Stroke Maternal Grandmother 45   Heart disease Maternal Grandmother     Allergies  Allergen Reactions   Tape Hives   Gabapentin Other (See Comments)    Headaches   Latex Rash    Current Medications:   Current Outpatient Medications:    albuterol (VENTOLIN HFA) 108 (90 Base) MCG/ACT inhaler, Inhale 2 puffs into the lungs  every 4 (four) hours as needed., Disp: , Rfl:    ALPRAZolam  (XANAX ) 0.25 MG tablet, Take 1 tablet (0.25 mg total) by mouth 2 (two) times daily as needed for anxiety., Disp: 30 tablet, Rfl: 1   furosemide  (LASIX ) 20 MG tablet, Take 1 tablet (20 mg total) by mouth daily as needed for fluid., Disp: 30 tablet, Rfl: 3   Multiple Vitamin (MULTIVITAMIN) tablet, Take 1 tablet by mouth at bedtime. , Disp: , Rfl:    Review of Systems:   Negative unless otherwise specified per HPI.  Vitals:   Vitals:   03/11/24 1149  BP: 120/84  Pulse: 82  Temp: 98.1 F (36.7 C)  TempSrc: Temporal  SpO2: 97%  Weight: (!) 351 lb 6.1 oz (159.4 kg)  Height: 5' 7.75 (1.721 m)      Body mass index is 53.82 kg/m.  Physical Exam:   Physical Exam Vitals and nursing note reviewed.  Constitutional:      General: She is not in acute distress.    Appearance: Normal appearance. She is well-developed. She is not ill-appearing or toxic-appearing.  HENT:     Head: Normocephalic and atraumatic.     Right Ear: Tympanic membrane, ear canal and external ear normal. Tympanic membrane is not erythematous, retracted or bulging.     Left Ear: Tympanic membrane, ear canal and external ear normal. Tympanic membrane is not erythematous, retracted or bulging.     Nose: Nose normal.     Right Sinus: No maxillary sinus tenderness or frontal sinus tenderness.     Left Sinus: No maxillary sinus tenderness or frontal sinus tenderness.     Mouth/Throat:     Pharynx: Uvula midline. No posterior oropharyngeal erythema.  Eyes:     General: Lids are normal.     Extraocular Movements: Extraocular movements intact.     Conjunctiva/sclera: Conjunctivae normal.  Neck:     Trachea: Trachea normal.  Cardiovascular:     Rate and Rhythm: Normal rate and regular rhythm.     Pulses: Normal pulses.     Heart sounds: Normal heart sounds, S1 normal and S2 normal.  Pulmonary:     Effort: Pulmonary effort is normal.     Breath sounds: Normal breath sounds. No decreased breath sounds, wheezing, rhonchi or rales.  Musculoskeletal:        General: Normal range of motion.     Cervical back: Normal range of motion and neck supple.     Comments: Proximal anterior aspect of left lower leg with mild area of swelling; no erythema or tenderness to palpation  No calf tenderness to palpation or erythema/swelling  Lymphadenopathy:     Cervical: No cervical adenopathy.  Skin:    General: Skin is warm and dry.  Neurological:     Mental Status: She is alert and oriented to person, place, and time.     GCS: GCS eye subscore is 4. GCS verbal subscore is 5. GCS motor subscore is 6.  Psychiatric:        Attention  and Perception: Attention and perception normal.        Mood and Affect: Mood normal.        Speech: Speech normal.        Behavior: Behavior normal. Behavior is cooperative.        Thought Content: Thought content normal.        Judgment: Judgment normal.     Assessment and Plan:   Assessment and Plan    Subacute cough Acute  bronchitis with suspected reactive airway disease Intermittent wheezy cough suggests possible reactive airway disease. Symptoms do not indicate pneumonia. History of similar episodes treated with antibiotics and steroids. Discussed potential for reactive airway disease causing lingering cough. - Prescribe steroid inhaler for proactive use in future respiratory illnesses. - Prescribe Augmentin . - Cancel doxycycline prescription. - Advised proactive inhaler use if exposed to respiratory illnesses.  Obstructive sleep apnea; Morbid Obesity On CPAP therapy with 70% adherence. Insurance requires adherence documentation for continued coverage. - Send CPAP adherence report via MyChart for insurance resubmission. - Refill Zepbound  and trial PA again  Localized skin mass, lump, swelling Recurrent, non-tender swelling with no associated symptoms. Previous occurrence resolved spontaneously. No discrete nodule, lipoma unlikely. I do not suspect deep vein thrombosis. - Consider compression with ACE bandage. - Discuss imaging if symptoms worsen.          Lucie Buttner, PA-C

## 2024-03-11 NOTE — Telephone Encounter (Signed)
 Please do PA for Zepbound , see Sleep Report entered today in chart.

## 2024-03-11 NOTE — Telephone Encounter (Signed)
 Pharmacy Patient Advocate Encounter   Received notification from Pt Calls Messages that prior authorization for Zepbound  2.5MG /0.5ML pen-injectors is required/requested.   Insurance verification completed.   The patient is insured through [facility].   Per test claim: PA required; PA submitted to above mentioned insurance via Latent Key/confirmation #/EOC [id number] Status is pending

## 2024-03-11 NOTE — Telephone Encounter (Signed)
 Noted, message to sent PA Team.

## 2024-03-15 NOTE — Telephone Encounter (Signed)
 Pharmacy Patient Advocate Encounter  Received notification from [employer] that Prior Authorization for Zepbound  2.5MG /0.5ML Auto-injectors has been DENIED.  Full denial letter will be uploaded to the media tab. See denial reason below.    PA #/Case ID/Reference #: [id number] .

## 2024-03-16 NOTE — Telephone Encounter (Signed)
 Please see message and advise

## 2024-03-23 ENCOUNTER — Other Ambulatory Visit (HOSPITAL_COMMUNITY): Payer: Self-pay

## 2024-05-10 ENCOUNTER — Ambulatory Visit: Payer: 59 | Admitting: Radiology

## 2024-05-25 ENCOUNTER — Encounter: Payer: Self-pay | Admitting: Radiology

## 2024-05-25 ENCOUNTER — Ambulatory Visit: Admitting: Radiology

## 2024-05-25 VITALS — BP 118/84 | HR 87 | Ht 67.5 in | Wt 348.0 lb

## 2024-05-25 DIAGNOSIS — Z01419 Encounter for gynecological examination (general) (routine) without abnormal findings: Secondary | ICD-10-CM

## 2024-05-25 DIAGNOSIS — Z6841 Body Mass Index (BMI) 40.0 and over, adult: Secondary | ICD-10-CM | POA: Diagnosis not present

## 2024-05-25 DIAGNOSIS — G4733 Obstructive sleep apnea (adult) (pediatric): Secondary | ICD-10-CM

## 2024-05-25 DIAGNOSIS — Z1331 Encounter for screening for depression: Secondary | ICD-10-CM

## 2024-05-25 MED ORDER — ZEPBOUND 2.5 MG/0.5ML ~~LOC~~ SOAJ: 2.5000 mg | SUBCUTANEOUS | 0 refills | Status: AC

## 2024-05-25 NOTE — Progress Notes (Signed)
 Amanda Davis August 17, 1985 [id number]   History:  38 y.o. G2P2 presents for annual exam. Struggling with her obesity, prediabetes, fatty liver and sleep apnea. AHI 33, using her CPAP. A1C 6.0, worse from 5.8 6 months prior. Exercising by walking, eats a low fat diet, finds she is eating much less since having her gallbladder removed. Frustrated she is not losing weight.  Gynecologic History Patient's last menstrual period was 05/04/2024 (exact date). Period Cycle (Days): 28 Period Duration (Days): 5 Period Pattern: Regular Menstrual Flow: Heavy, Light (heavy first 3 days) Menstrual Control: Thin pad, Maxi pad Dysmenorrhea: None Contraception/Family planning: tubal ligation Sexually active: yes Last Pap: 2019. Results were: normal   Obstetric History OB History  Gravida Para Term Preterm AB Living  2 2 2  0 0 2  SAB IAB Ectopic Multiple Live Births  0 0 0  2    # Outcome Date GA Lbr Len/2nd Weight Sex Type Anes PTL Lv  2 Term 07/29/12    F Vag-Spont   LIV  1 Term 10/14/09    M Vag-Spont   LIV    The following portions of the patient's history were reviewed and updated as appropriate: allergies, current medications, past family history, past medical history, past social history, past surgical history, and problem list.  Review of Systems  All other systems reviewed and are negative.     05/25/2024   11:49 AM 02/26/2024   10:35 AM 10/19/2023    2:59 PM  Depression screen PHQ 2/9  Decreased Interest 0 0 0  Down, Depressed, Hopeless 0 0 0  PHQ - 2 Score 0 0 0  Altered sleeping  0   Tired, decreased energy  0   Change in appetite  0   Feeling bad or failure about yourself   0   Trouble concentrating  0   Moving slowly or fidgety/restless  0   Suicidal thoughts  0   PHQ-9 Score  0    Difficult doing work/chores  Not difficult at all      Data saved with a previous flowsheet row definition     Past medical history, past surgical history, family history and social  history were all reviewed and documented in the EPIC chart.  Exam:  Vitals:   05/25/24 1147  BP: 118/84  Pulse: 87  SpO2: 97%  Weight: (!) 348 lb (157.9 kg)  Height: 5' 7.5 (1.715 m)   Body mass index is 53.7 kg/m.    Physical Exam Vitals and nursing note reviewed. Exam conducted with a chaperone present.  Constitutional:      Appearance: Normal appearance. She is obese.  HENT:     Head: Normocephalic and atraumatic.  Neck:     Thyroid : No thyroid  mass, thyromegaly or thyroid  tenderness.  Cardiovascular:     Rate and Rhythm: Regular rhythm.     Heart sounds: Normal heart sounds.  Pulmonary:     Effort: Pulmonary effort is normal.     Breath sounds: Normal breath sounds.  Chest:  Breasts:    Breasts are symmetrical.     Right: Normal. No inverted nipple, mass, nipple discharge, skin change or tenderness.     Left: Normal. No inverted nipple, mass, nipple discharge, skin change or tenderness.  Abdominal:     General: Abdomen is flat. Bowel sounds are normal.     Palpations: Abdomen is soft.  Genitourinary:    General: Normal vulva.     Vagina: Normal. No vaginal discharge, bleeding or lesions.  Cervix: Normal. No discharge or lesion.     Uterus: Normal. Not enlarged and not tender.      Adnexa: Right adnexa normal and left adnexa normal.       Right: No mass, tenderness or fullness.         Left: No mass, tenderness or fullness.    Lymphadenopathy:     Upper Body:     Right upper body: No axillary adenopathy.     Left upper body: No axillary adenopathy.  Skin:    General: Skin is warm and dry.  Neurological:     Mental Status: She is alert and oriented to person, place, and time.  Psychiatric:        Mood and Affect: Mood normal.        Thought Content: Thought content normal.        Judgment: Judgment normal.      Darice Hoit, CMA present for exam  Current weight: 348 First goal: 300 Overall goal: 225   Assessment/Plan:   1. Well woman exam with  routine gynecological exam (Primary) Pap 2027 Mammo scheduled  2. Sleep apnea, obstructive AHI 33.9 - tirzepatide  (ZEPBOUND ) 2.5 MG/0.5ML Pen; Inject 2.5 mg into the skin once a week.  Dispense: 2 mL; Refill: 0  3. Morbid obesity (HCC) - tirzepatide  (ZEPBOUND ) 2.5 MG/0.5ML Pen; Inject 2.5 mg into the skin once a week.  Dispense: 2 mL; Refill: 0  4. BMI 50.0-59.9, adult (HCC) - tirzepatide  (ZEPBOUND ) 2.5 MG/0.5ML Pen; Inject 2.5 mg into the skin once a week.  Dispense: 2 mL; Refill: 0  5. Depression screen   AEX 1 year. Med check in 4 weeks after starting zepbound . Patient will continue to eat a low fat, low carb diet. She will continue to exercise daily.   [name] B WHNP-BC 12:10 PM 05/25/2024

## 2024-05-27 ENCOUNTER — Telehealth: Payer: Self-pay

## 2024-05-27 NOTE — Telephone Encounter (Signed)
 We received your request for prior authorization for ZEPBOUND , for the above member; however, [employer] has a denied request on file for ZEPBOUND  for this member. Patient notified.

## 2024-05-27 NOTE — Telephone Encounter (Signed)
 Prior authorization request received from covermymeds for Zepbound  2.5 mg.  PA initiated.  Patient notified. KEY: [id number]

## 2024-06-27 LAB — HM MAMMOGRAPHY

## 2024-09-08 ENCOUNTER — Telehealth: Admitting: Adult Health

## 2024-10-19 ENCOUNTER — Encounter: Admitting: Physician Assistant

## 2025-05-26 ENCOUNTER — Ambulatory Visit: Admitting: Radiology
